# Patient Record
Sex: Female | Born: 1990 | Race: Black or African American | Hispanic: No | Marital: Single | Smoking: Never smoker
Health system: Southern US, Community
[De-identification: ages and names within clinical notes are randomized; demographics above are authoritative.]

## PROBLEM LIST (undated history)

## (undated) DIAGNOSIS — N939 Abnormal uterine and vaginal bleeding, unspecified: Secondary | ICD-10-CM

## (undated) DIAGNOSIS — Z973 Presence of spectacles and contact lenses: Secondary | ICD-10-CM

## (undated) DIAGNOSIS — R7303 Prediabetes: Secondary | ICD-10-CM

## (undated) DIAGNOSIS — D500 Iron deficiency anemia secondary to blood loss (chronic): Secondary | ICD-10-CM

## (undated) DIAGNOSIS — R0600 Dyspnea, unspecified: Secondary | ICD-10-CM

## (undated) DIAGNOSIS — J4530 Mild persistent asthma, uncomplicated: Secondary | ICD-10-CM

## (undated) DIAGNOSIS — D259 Leiomyoma of uterus, unspecified: Secondary | ICD-10-CM

---
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
## 2010-05-30 ENCOUNTER — Other Ambulatory Visit (HOSPITAL_COMMUNITY): Payer: Self-pay | Admitting: Obstetrics and Gynecology

## 2010-05-30 DIAGNOSIS — O2690 Pregnancy related conditions, unspecified, unspecified trimester: Secondary | ICD-10-CM

## 2010-06-05 ENCOUNTER — Ambulatory Visit (HOSPITAL_COMMUNITY): Payer: Self-pay

## 2010-06-10 ENCOUNTER — Ambulatory Visit (HOSPITAL_COMMUNITY)
Admission: RE | Admit: 2010-06-10 | Discharge: 2010-06-10 | Disposition: A | Discharge status: Home / Self Care | Payer: Medicaid Other | Source: Ambulatory Visit | Attending: Obstetrics and Gynecology | Admitting: Obstetrics and Gynecology

## 2010-06-10 DIAGNOSIS — O352XX0 Maternal care for (suspected) hereditary disease in fetus, not applicable or unspecified: Secondary | ICD-10-CM | POA: Insufficient documentation

## 2010-06-10 DIAGNOSIS — O34219 Maternal care for unspecified type scar from previous cesarean delivery: Secondary | ICD-10-CM | POA: Insufficient documentation

## 2010-06-10 DIAGNOSIS — Z1389 Encounter for screening for other disorder: Secondary | ICD-10-CM | POA: Insufficient documentation

## 2010-06-10 DIAGNOSIS — O358XX0 Maternal care for other (suspected) fetal abnormality and damage, not applicable or unspecified: Secondary | ICD-10-CM | POA: Insufficient documentation

## 2010-06-10 DIAGNOSIS — O0930 Supervision of pregnancy with insufficient antenatal care, unspecified trimester: Secondary | ICD-10-CM | POA: Insufficient documentation

## 2010-06-10 DIAGNOSIS — Z363 Encounter for antenatal screening for malformations: Secondary | ICD-10-CM | POA: Insufficient documentation

## 2010-06-10 DIAGNOSIS — O2690 Pregnancy related conditions, unspecified, unspecified trimester: Secondary | ICD-10-CM

## 2010-06-30 LAB — BASIC METABOLIC PANEL
Calcium: 9.6 mg/dL (ref 8.4–10.5)
Glucose, Bld: 97 mg/dL (ref 70–99)
Sodium: 138 mEq/L (ref 135–145)

## 2010-06-30 LAB — HEPATIC FUNCTION PANEL
AST: 23 U/L (ref 0–37)
Bilirubin, Direct: <0.1 mg/dL (ref 0.0–0.3)
Total Bilirubin: 0.3 mg/dL (ref 0.3–1.2)

## 2010-06-30 LAB — URINALYSIS, ROUTINE W REFLEX MICROSCOPIC
Nitrite: NEGATIVE
Specific Gravity, Urine: 1.011 (ref 1.005–1.030)
Urobilinogen, UA: 0.2 mg/dL (ref 0.0–1.0)

## 2010-06-30 LAB — CBC
Hemoglobin: 12.8 g/dL (ref 12.0–16.0)
RDW: 14.3 % (ref 11.4–15.5)

## 2010-06-30 LAB — DRUGS OF ABUSE SCREEN W/O ALC, ROUTINE URINE
Amphetamine Screen, Ur: NEGATIVE
Benzodiazepines.: NEGATIVE
Cocaine Metabolites: NEGATIVE
Opiate Screen, Urine: NEGATIVE
Phencyclidine (PCP): NEGATIVE
Propoxyphene: NEGATIVE

## 2010-06-30 LAB — DIFFERENTIAL
Basophils Absolute: 0 10*3/uL (ref 0.0–0.1)
Lymphocytes Relative: 41 % (ref 24–48)
Monocytes Absolute: 0.5 10*3/uL (ref 0.2–1.2)
Neutro Abs: 2.8 10*3/uL (ref 1.7–8.0)

## 2010-06-30 LAB — TSH: TSH: 0.516 u[IU]/mL (ref 0.350–4.500)

## 2010-06-30 LAB — GC/CHLAMYDIA PROBE AMP, URINE
Chlamydia, Swab/Urine, PCR: NEGATIVE
GC Probe Amp, Urine: NEGATIVE

## 2010-06-30 LAB — T4, FREE: Free T4: 0.99 ng/dL (ref 0.89–1.80)

## 2010-06-30 LAB — RPR: RPR Ser Ql: NONREACTIVE
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
## 2017-04-13 ENCOUNTER — Encounter (HOSPITAL_COMMUNITY): Payer: Self-pay | Admitting: Emergency Medicine

## 2017-04-13 ENCOUNTER — Ambulatory Visit (HOSPITAL_COMMUNITY)
Admission: EM | Admit: 2017-04-13 | Discharge: 2017-04-13 | Disposition: A | Discharge status: Home / Self Care | Payer: Self-pay | Attending: Family Medicine | Admitting: Family Medicine

## 2017-04-13 ENCOUNTER — Other Ambulatory Visit: Payer: Self-pay

## 2017-04-13 DIAGNOSIS — A749 Chlamydial infection, unspecified: Secondary | ICD-10-CM

## 2017-04-13 MED ORDER — AZITHROMYCIN 250 MG PO TABS
ORAL_TABLET | ORAL | Status: AC
  Filled 2017-04-13: qty 4

## 2017-04-13 MED ORDER — AZITHROMYCIN 250 MG PO TABS
1000.0000 mg | ORAL_TABLET | Freq: Once | ORAL | Status: AC
  Administered 2017-04-13: 1000 mg via ORAL

## 2017-04-13 NOTE — ED Provider Notes (Signed)
  [facility]   [id number] 04/13/17 Arrival Time: 1550  ASSESSMENT & PLAN:  1. Chlamydia     Meds ordered this encounter  Medications  . azithromycin (ZITHROMAX) tablet 1,000 mg   She declines further testing today. Testing approx 2 weeks ago revealed + chlamydia and - gonorrhea. Instructed to refrain from sexual activity for at least seven days. Her partner is being treated also.  Reviewed expectations re: course of current medical issues. Questions answered. Outlined signs and symptoms indicating need for more acute intervention. Patient verbalized understanding. After Visit Summary given.   SUBJECTIVE:  Joanne Schneider is a 27 y.o. female who presents with complaint of slight vaginal discharge. Onset gradual, a few weeks ago. Describes discharge as thin and white/yellow but intermittent. Urinary symptoms: none. Afebrile. No abdominal or pelvic pain. No n/v. No rashes or lesions. Sexually active with single female partner. OTC treatment: None. History of similar symptoms: No  Reports recent STD testing 1-2 weeks ago revealed positive Chlamydia. Requesting treatment today.  Patient's last menstrual period was 03/13/2017.  ROS: As per HPI.  OBJECTIVE:  Vitals:   04/13/17 1635  BP: 121/84  Pulse: (!) 103  Resp: 18  Temp: 98.8 F (37.1 C)  TempSrc: Oral  SpO2: 96%    General appearance: alert, cooperative, appears stated age and no distress Throat: lips, mucosa, and tongue normal; teeth and gums normal Back: no CVA tenderness Abdomen: soft, non-tender; bowel sounds normal; no masses or organomegaly; no guarding or rebound tenderness GU: declines Skin: warm and dry Psychological:  Alert and cooperative. Normal mood and affect.   No Known Allergies   Family History  Problem Relation Age of Onset  . Diabetes Father   . Cancer Sister    Social History   Socioeconomic History  . Marital status: Single    Spouse name: Not on file  . Number of  children: Not on file  . Years of education: Not on file  . Highest education level: Not on file  Social Needs  . Financial resource strain: Not on file  . Food insecurity - worry: Not on file  . Food insecurity - inability: Not on file  . Transportation needs - medical: Not on file  . Transportation needs - non-medical: Not on file  Occupational History  . Not on file  Tobacco Use  . Smoking status: Never Smoker  Substance and Sexual Activity  . Alcohol use: Yes  . Drug use: Yes    Types: Marijuana  . Sexual activity: Not on file  Other Topics Concern  . Not on file  Social History Narrative  . Not on file          Mardella LaymanHagler, Gaelle Adriance, MD 04/14/17 [phone]

## 2017-04-13 NOTE — ED Notes (Signed)
Sent patient to bathroom for a dirty and clean specimen with instruction

## 2017-04-13 NOTE — Discharge Instructions (Signed)
You have been given the following medications today for treatment of chlamydia:  azithromycin (ZITHROMAX) tablet 1,000 mg  Please refrain from all sexual activity for at least the next seven days.
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
## 2018-11-27 ENCOUNTER — Encounter (HOSPITAL_COMMUNITY): Payer: Self-pay | Admitting: Emergency Medicine

## 2018-11-27 ENCOUNTER — Emergency Department (HOSPITAL_COMMUNITY)
Admission: EM | Admit: 2018-11-27 | Discharge: 2018-11-27 | Disposition: A | Discharge status: Home / Self Care | Payer: Self-pay | Attending: Emergency Medicine | Admitting: Emergency Medicine

## 2018-11-27 ENCOUNTER — Other Ambulatory Visit: Payer: Self-pay

## 2018-11-27 DIAGNOSIS — R112 Nausea with vomiting, unspecified: Secondary | ICD-10-CM | POA: Insufficient documentation

## 2018-11-27 DIAGNOSIS — R102 Pelvic and perineal pain: Secondary | ICD-10-CM | POA: Insufficient documentation

## 2018-11-27 LAB — COMPREHENSIVE METABOLIC PANEL
ALT: 17 U/L (ref 0–44)
AST: 23 U/L (ref 15–41)
Albumin: 4.2 g/dL (ref 3.5–5.0)
Alkaline Phosphatase: 52 U/L (ref 38–126)
Anion gap: 11 (ref 5–15)
BUN: 8 mg/dL (ref 6–20)
CO2: 23 mmol/L (ref 22–32)
Calcium: 8.9 mg/dL (ref 8.9–10.3)
Chloride: 106 mmol/L (ref 98–111)
Creatinine, Ser: 0.74 mg/dL (ref 0.44–1.00)
GFR calc Af Amer: >60 mL/min (ref >60)
GFR calc non Af Amer: >60 mL/min (ref >60)
Glucose, Bld: 105 mg/dL — ABNORMAL HIGH (ref 70–99)
Potassium: 3.9 mmol/L (ref 3.5–5.1)
Sodium: 140 mmol/L (ref 135–145)
Total Bilirubin: 0.6 mg/dL (ref 0.3–1.2)
Total Protein: 7.8 g/dL (ref 6.5–8.1)

## 2018-11-27 LAB — CBC WITH DIFFERENTIAL/PLATELET
Abs Immature Granulocytes: 0.07 10*3/uL (ref 0.00–0.07)
Basophils Absolute: 0 10*3/uL (ref 0.0–0.1)
Basophils Relative: 0 %
Eosinophils Absolute: 0 10*3/uL (ref 0.0–0.5)
Eosinophils Relative: 0 %
HCT: 40.2 % (ref 36.0–46.0)
Hemoglobin: 12.7 g/dL (ref 12.0–15.0)
Immature Granulocytes: 1 %
Lymphocytes Relative: 15 %
Lymphs Abs: 1.6 10*3/uL (ref 0.7–4.0)
MCH: 28.2 pg (ref 26.0–34.0)
MCHC: 31.6 g/dL (ref 30.0–36.0)
MCV: 89.1 fL (ref 80.0–100.0)
Monocytes Absolute: 0.3 10*3/uL (ref 0.1–1.0)
Monocytes Relative: 3 %
Neutro Abs: 8.6 10*3/uL — ABNORMAL HIGH (ref 1.7–7.7)
Neutrophils Relative %: 81 %
Platelets: 272 10*3/uL (ref 150–400)
RBC: 4.51 MIL/uL (ref 3.87–5.11)
RDW: 13.3 % (ref 11.5–15.5)
WBC: 10.6 10*3/uL — ABNORMAL HIGH (ref 4.0–10.5)
nRBC: 0 % (ref 0.0–0.2)

## 2018-11-27 LAB — POC URINE PREG, ED: Preg Test, Ur: NEGATIVE

## 2018-11-27 LAB — URINALYSIS, ROUTINE W REFLEX MICROSCOPIC
Bilirubin Urine: NEGATIVE
Glucose, UA: NEGATIVE mg/dL
Hgb urine dipstick: NEGATIVE
Ketones, ur: 80 mg/dL — AB
Leukocytes,Ua: NEGATIVE
Nitrite: NEGATIVE
Protein, ur: NEGATIVE mg/dL
Specific Gravity, Urine: 1.025 (ref 1.005–1.030)
pH: 6 (ref 5.0–8.0)

## 2018-11-27 LAB — I-STAT BETA HCG BLOOD, ED (MC, WL, AP ONLY): I-stat hCG, quantitative: 5.1 m[IU]/mL — ABNORMAL HIGH (ref <5)

## 2018-11-27 LAB — LIPASE, BLOOD: Lipase: 27 U/L (ref 11–51)

## 2018-11-27 MED ORDER — SODIUM CHLORIDE 0.9 % IV BOLUS
1000.0000 mL | Freq: Once | INTRAVENOUS | Status: AC
  Administered 2018-11-27: 1000 mL via INTRAVENOUS

## 2018-11-27 MED ORDER — HALOPERIDOL LACTATE 5 MG/ML IJ SOLN
2.0000 mg | Freq: Once | INTRAMUSCULAR | Status: AC
  Administered 2018-11-27: 13:00:00 2 mg via INTRAVENOUS
  Filled 2018-11-27: qty 1

## 2018-11-27 NOTE — ED Notes (Signed)
Pt requesting something to drink. Messaged Dr Langston Masker. Awaiting a response.

## 2018-11-27 NOTE — ED Triage Notes (Signed)
Pt reports symptoms started around 5am. Reports intermittent abd pains. Reports vomiting episodes every 30 minutes.  LMP-8/16-8/21, then Sept had dark spotting 2-8.  Smokes weed everyday, last yesterday, none today.

## 2018-11-27 NOTE — ED Provider Notes (Signed)
[facility] DEPT Provider Note   CSN: [id number] Arrival date & time: 11/27/18  1132     History   Chief Complaint Nausea and vomiting  HPI Joanne Schneider is a 28 y.o. female with a history of daily marijuana smoking presented emergency department with abrupt onset of nausea and vomiting.  She reports she was in her usual state of health yesterday, and reports she did not eat eat any food yesterday because she was upset.  She states she went to bed and woke up promptly around 5 AM with sudden onset abdominal discomfort, and had multiple episodes of nausea and vomiting.  She reports her symptoms have improved since arriving in the ED but she still has diffuse cramping in her abdomen essentially located suprapubically and near her epigastrium.  1 episode of nausea and vomiting like this in the past, treated in [facility] ED and sent home.  PSHx of 1 C-section  No other medical problems NKDA  She denies any history of gastroparesis or small bowel obstruction that she is aware of.  She denies any fevers, chills, dysuria, hematuria, vaginal discharge.  Since arriving in the emergency department she did have one loose bowel movement here in the ED and is able to pass gas.     HPI  History reviewed. No pertinent past medical history.  There are no active problems to display for this patient.   Past Surgical History:  Procedure Laterality Date  . CESAREAN SECTION       OB History   No obstetric history on file.      Home Medications    Prior to Admission medications   Not on File    Family History Family History  Problem Relation Age of Onset  . Diabetes Father   . Cancer Sister     Social History Social History   Tobacco Use  . Smoking status: Never Smoker  . Smokeless tobacco: Never Used  Substance Use Topics  . Alcohol use: Yes  . Drug use: Yes    Types: Marijuana     Allergies   Percocet  [oxycodone-acetaminophen]   Review of Systems Review of Systems  Constitutional: Negative for chills and fever.  Eyes: Negative for pain and visual disturbance.  Respiratory: Negative for cough and shortness of breath.   Cardiovascular: Negative for chest pain and palpitations.  Gastrointestinal: Positive for abdominal pain, nausea and vomiting. Negative for abdominal distention, blood in stool and constipation.  Genitourinary: Negative for dysuria and hematuria.  Musculoskeletal: Negative for arthralgias and back pain.  Skin: Negative for color change and rash.  Neurological: Negative for seizures and syncope.  All other systems reviewed and are negative.    Physical Exam Updated Vital Signs BP 109/66 (BP Location: Left Arm)   Pulse 60   Temp 97.7 F (36.5 C) (Oral)   Resp 16   LMP 11/16/2018   SpO2 100%   Physical Exam Vitals signs and nursing note reviewed.  Constitutional:      General: She is not in acute distress.    Appearance: She is well-developed.  HENT:     Head: Normocephalic and atraumatic.  Eyes:     Conjunctiva/sclera: Conjunctivae normal.  Neck:     Musculoskeletal: Neck supple.  Cardiovascular:     Rate and Rhythm: Normal rate and regular rhythm.     Heart sounds: No murmur.  Pulmonary:     Effort: Pulmonary effort is normal. No respiratory distress.     Breath sounds: Normal  breath sounds.  Abdominal:     General: Abdomen is flat. There is no distension.     Palpations: Abdomen is soft.     Tenderness: There is abdominal tenderness in the epigastric area. There is no right CVA tenderness, left CVA tenderness, guarding or rebound. Negative signs include Murphy's sign, Rovsing's sign, McBurney's sign and psoas sign.     Comments: Mild tenderness  Skin:    General: Skin is warm and dry.  Neurological:     General: No focal deficit present.     Mental Status: She is alert and oriented to person, place, and time.  Psychiatric:        Mood and  Affect: Mood normal.        Behavior: Behavior normal.      ED Treatments / Results  Labs (all labs ordered are listed, but only abnormal results are displayed) Labs Reviewed  COMPREHENSIVE METABOLIC PANEL - Abnormal; Notable for the following components:      Result Value   Glucose, Bld 105 (*)    All other components within normal limits  CBC WITH DIFFERENTIAL/PLATELET - Abnormal; Notable for the following components:   WBC 10.6 (*)    Neutro Abs 8.6 (*)    All other components within normal limits  URINALYSIS, ROUTINE W REFLEX MICROSCOPIC - Abnormal; Notable for the following components:   Ketones, ur 80 (*)    All other components within normal limits  I-STAT BETA HCG BLOOD, ED ([facility], [facility], [facility] ONLY) - Abnormal; Notable for the following components:   I-stat hCG, quantitative 5.1 (*)    All other components within normal limits  LIPASE, BLOOD  POC URINE PREG, ED    EKG None  Radiology No results found.  Procedures Procedures (including critical care time)  Medications Ordered in ED Medications  sodium chloride 0.9 % bolus 1,000 mL (0 mLs Intravenous Stopped 11/27/18 1454)  haloperidol lactate (HALDOL) injection 2 mg (2 mg Intravenous Given 11/27/18 1313)     Initial Impression / Assessment and Plan / ED Course  I have reviewed the triage vital signs and the nursing notes.  Pertinent labs & imaging results that were available during my care of the patient were reviewed by me and considered in my medical decision making (see chart for details).   28 year old female presented emerge department with abdominal pain and abrupt onset of nausea and vomiting beginning at 5 AM this morning.  She is a daily marijuana smoker and I suspect this may be related to cyclical vomiting.  However we will check electrolytes to determine if there are no derangements as well as a lipase level.  We will also check a UA as she has some mild suprapubic tenderness.  We will check a pregnancy  test.  She has no focal tenderness of right upper quadrant negative Murphy sign, I have a lower suspicion for acute cholecystitis.  She has no abdominal distention to suggest small bowel obstruction.  She did have a bowel movement while here in the ED.  I have a lower suspicion for sepsis or acute intra-abdominal infection given her benign physical exam and her vital signs here.  Plan to give IV fluids and a dose of IV Haldol, p.o. challenge and reassess.   Final Clinical Impressions(s) / ED Diagnoses   Final diagnoses:  Non-intractable vomiting with nausea, unspecified vomiting type    ED Discharge Orders    None       Terald Sleeperrifan, Dotti Busey J, MD 11/27/18 2309

## 2018-11-27 NOTE — Discharge Instructions (Addendum)
Please avoid smoking marijuana or using this substance.  It may be triggering your nausea and vomiting.  Drink LOTS of fluids for the next few days - enough that your urine looks clear.

## 2018-11-27 NOTE — ED Notes (Signed)
Pt given Sprite 

## 2018-11-27 NOTE — ED Notes (Signed)
Urine sample not obtained when patient went to restroom.

## 2018-11-27 NOTE — ED Triage Notes (Signed)
[facility] pt from home for n/v that started this morning. Pt also having lower abd pains.  LMP 9/6-8 had heavier flow.  Vitals: 124/92, 76hr 16R.

## 2019-06-11 ENCOUNTER — Encounter: Payer: Self-pay | Admitting: Emergency Medicine

## 2019-06-11 ENCOUNTER — Other Ambulatory Visit: Payer: Self-pay

## 2019-06-11 ENCOUNTER — Emergency Department: Payer: Self-pay

## 2019-06-11 ENCOUNTER — Emergency Department
Admission: EM | Admit: 2019-06-11 | Discharge: 2019-06-11 | Disposition: A | Discharge status: Home / Self Care | Payer: Self-pay | Attending: Emergency Medicine | Admitting: Emergency Medicine

## 2019-06-11 DIAGNOSIS — Z202 Contact with and (suspected) exposure to infections with a predominantly sexual mode of transmission: Secondary | ICD-10-CM | POA: Insufficient documentation

## 2019-06-11 DIAGNOSIS — L03115 Cellulitis of right lower limb: Secondary | ICD-10-CM | POA: Insufficient documentation

## 2019-06-11 LAB — WET PREP, GENITAL
Clue Cells Wet Prep HPF POC: NONE SEEN
Sperm: NONE SEEN
Trich, Wet Prep: NONE SEEN
Yeast Wet Prep HPF POC: NONE SEEN

## 2019-06-11 LAB — URINALYSIS, COMPLETE (UACMP) WITH MICROSCOPIC
Bilirubin Urine: NEGATIVE
Glucose, UA: NEGATIVE mg/dL
Hgb urine dipstick: NEGATIVE
Ketones, ur: NEGATIVE mg/dL
Leukocytes,Ua: NEGATIVE
Nitrite: NEGATIVE
Protein, ur: NEGATIVE mg/dL
Specific Gravity, Urine: 1.016 (ref 1.005–1.030)
pH: 7 (ref 5.0–8.0)

## 2019-06-11 LAB — CHLAMYDIA/NGC RT PCR (ARMC ONLY)??????????: N gonorrhoeae: NOT DETECTED

## 2019-06-11 LAB — CBC WITH DIFFERENTIAL/PLATELET
Abs Immature Granulocytes: 0.02 10*3/uL (ref 0.00–0.07)
Basophils Absolute: 0 10*3/uL (ref 0.0–0.1)
Basophils Relative: 0 %
Eosinophils Absolute: 0 10*3/uL (ref 0.0–0.5)
Eosinophils Relative: 0 %
HCT: 41.2 % (ref 36.0–46.0)
Hemoglobin: 13.3 g/dL (ref 12.0–15.0)
Immature Granulocytes: 0 %
Lymphocytes Relative: 38 %
Lymphs Abs: 2.6 10*3/uL (ref 0.7–4.0)
MCH: 28.3 pg (ref 26.0–34.0)
MCHC: 32.3 g/dL (ref 30.0–36.0)
MCV: 87.7 fL (ref 80.0–100.0)
Monocytes Absolute: 0.6 10*3/uL (ref 0.1–1.0)
Monocytes Relative: 9 %
Neutro Abs: 3.5 10*3/uL (ref 1.7–7.7)
Neutrophils Relative %: 53 %
Platelets: 267 10*3/uL (ref 150–400)
RBC: 4.7 MIL/uL (ref 3.87–5.11)
RDW: 12.7 % (ref 11.5–15.5)
WBC: 6.8 10*3/uL (ref 4.0–10.5)
nRBC: 0 % (ref 0.0–0.2)

## 2019-06-11 LAB — BASIC METABOLIC PANEL
Anion gap: 8 (ref 5–15)
BUN: 6 mg/dL (ref 6–20)
CO2: 27 mmol/L (ref 22–32)
Calcium: 9.4 mg/dL (ref 8.9–10.3)
Chloride: 102 mmol/L (ref 98–111)
Creatinine, Ser: 0.72 mg/dL (ref 0.44–1.00)
GFR calc Af Amer: >60 mL/min (ref >60)
GFR calc non Af Amer: >60 mL/min (ref >60)
Glucose, Bld: 93 mg/dL (ref 70–99)
Potassium: 4.3 mmol/L (ref 3.5–5.1)
Sodium: 137 mmol/L (ref 135–145)

## 2019-06-11 LAB — POCT PREGNANCY, URINE: Preg Test, Ur: NEGATIVE

## 2019-06-11 LAB — CHLAMYDIA/NGC RT PCR (ARMC ONLY): Chlamydia Tr: NOT DETECTED

## 2019-06-11 MED ORDER — LIDOCAINE HCL (PF) 1 % IJ SOLN
2.1000 mL | Freq: Once | INTRAMUSCULAR | Status: AC
  Administered 2019-06-11: 16:00:00 2.1 mL
  Filled 2019-06-11: qty 5

## 2019-06-11 MED ORDER — CEPHALEXIN 500 MG PO CAPS: 500.0000 mg | ORAL_CAPSULE | Freq: Three times a day (TID) | ORAL | 0 refills | Status: DC

## 2019-06-11 MED ORDER — CEFTRIAXONE SODIUM 1 G IJ SOLR
1.0000 g | Freq: Once | INTRAMUSCULAR | Status: AC
  Administered 2019-06-11: 1 g via INTRAMUSCULAR
  Filled 2019-06-11: qty 10

## 2019-06-11 MED ORDER — AZITHROMYCIN 500 MG PO TABS
1000.0000 mg | ORAL_TABLET | Freq: Once | ORAL | Status: AC
  Administered 2019-06-11: 1000 mg via ORAL
  Filled 2019-06-11: qty 2

## 2019-06-11 NOTE — ED Triage Notes (Signed)
Pt to ER states 2 days ago she hit her right ankle against her laptop while lying in bed. States noticed next AM that it felt like a carpet burn and then several hours later she was unable to bear weight, noted swelling and redness and increased pain.

## 2019-06-11 NOTE — Discharge Instructions (Signed)
Your chlamydia and gonorrhea test will be back in 24 to 48 hours.  Check MyChart for your results.  You have already been treated for these infections. If positive you should notify your partner and they should be tested/treated. If the redness on the ankle is spreading and becoming worse please return emergency department. Elevate the ankle above your heart while at home. Return if worsening

## 2019-06-11 NOTE — ED Provider Notes (Signed)
[facility] ([facility] Emergency Department Provider Note  ____________________________________________   First MD Initiated Contact with Patient 06/11/19 1356     (approximate)  I have reviewed the triage vital signs and the nursing notes.   HISTORY  Chief Complaint Ankle Pain    HPI Joanne Schneider is a 29 y.o. female presents emergency department complaining of right ankle pain.  Patient scraped her ankle on the bed she and itch of the computer.  She states it felt like a carpet burn.  Ankle has become very swollen and tender.  No fever or chills.  Unable to evaluate.    History reviewed. No pertinent past medical history.  There are no problems to display for this patient.   Past Surgical History:  Procedure Laterality Date  . CESAREAN SECTION      Prior to Admission medications   Medication Sig Start Date End Date Taking? Authorizing Provider  cephALEXin (KEFLEX) 500 MG capsule Take 1 capsule (500 mg total) by mouth 3 (three) times daily. 06/11/19   Sherrie Mustache Roselyn Bering, PA-C    Allergies Shellfish allergy, Peanut butter flavor, and Percocet [oxycodone-acetaminophen]  Family History  Problem Relation Age of Onset  . Diabetes Father   . Cancer Sister     Social History Social History   Tobacco Use  . Smoking status: Never Smoker  . Smokeless tobacco: Never Used  Substance Use Topics  . Alcohol use: Yes  . Drug use: Yes    Types: Marijuana    Review of Systems  Constitutional: No fever/chills Eyes: No visual changes. ENT: No sore throat. Respiratory: Denies cough Cardiovascular: Denies chest pain Gastrointestinal: Denies abdominal pain Genitourinary: Negative for dysuria. Musculoskeletal: Negative for back pain.  Positive for right ankle pain Skin: Negative for rash. Psychiatric: no mood changes,     ____________________________________________   PHYSICAL EXAM:  VITAL SIGNS: ED Triage Vitals  Enc Vitals Group     BP 06/11/19  1353 114/74     Pulse Rate 06/11/19 1353 70     Resp 06/11/19 1353 18     Temp 06/11/19 1353 98.5 F (36.9 C)     Temp Source 06/11/19 1353 Oral     SpO2 06/11/19 1353 100 %     Weight 06/11/19 1354 148 lb (67.1 kg)     Height 06/11/19 1354 5\' 5"  (1.651 m)     Head Circumference --      Peak Flow --      Pain Score 06/11/19 1353 9     Pain Loc --      Pain Edu? --      Excl. in GC? --     Constitutional: Alert and oriented. Well appearing and in no acute distress. Eyes: Conjunctivae are normal.  Head: Atraumatic. Nose: No congestion/rhinnorhea. Mouth/Throat: Mucous membranes are moist.   Neck:  supple no lymphadenopathy noted Cardiovascular: Normal rate, regular rhythm. Respiratory: Normal respiratory effort.  No retractions, GU: External vaginal exam does not show any herpetic type lesions, speculum exam shows a thick white discharge, there is no cervical motion tenderness Musculoskeletal: The right ankle is red and swollen, tender to palpation, patient is unable to bear weight.  Neurovascular is intact  Neurologic:  Normal speech and language.  Skin:  Skin is warm, dry and intact. No rash noted. Psychiatric: Mood and affect are normal. Speech and behavior are normal.  ____________________________________________   LABS (all labs ordered are listed, but only abnormal results are displayed)  Labs Reviewed  WET PREP,  GENITAL - Abnormal; Notable for the following components:      Result Value   WBC, Wet Prep HPF POC MODERATE (*)    All other components within normal limits  URINALYSIS, COMPLETE (UACMP) WITH MICROSCOPIC - Abnormal; Notable for the following components:   Color, Urine YELLOW (*)    APPearance HAZY (*)    Bacteria, UA RARE (*)    All other components within normal limits  CHLAMYDIA/NGC RT PCR ([facility] ONLY)  BASIC METABOLIC PANEL  CBC WITH DIFFERENTIAL/PLATELET  POC URINE PREG, ED  POCT PREGNANCY, URINE    ____________________________________________   ____________________________________________  RADIOLOGY  X-ray of the right ankle is negative  ____________________________________________   PROCEDURES  Procedure(s) performed: Rocephin 1 g p.o., Zithromax 1 g p.o., crutches were provided for the patient  Procedures    ____________________________________________   INITIAL IMPRESSION / ASSESSMENT AND PLAN / ED COURSE  Pertinent labs & imaging results that were available during my care of the patient were reviewed by me and considered in my medical decision making (see chart for details).   Patient is a 29 year old female presents emergency department with concerns of right ankle redness pain and swelling.  With the differential including a septic joint I did ask her about STDs and she wants to be checked as she is not sure.  She denies any fever or chills.  No abdominal pain.  Physical exam shows the right ankle to be red swollen and tender, there is a small abrasion noted on the lateral aspect.  Pelvic exam is basically normal other than a small amount of white thick discharge.  DDx: Cellulitis, septic joint, abrasion and wound infection, STDs  CBC is normal, ASIC metabolic panel is normal, POC pregnancy is negative, urinalysis is basically normal, wet prep shows moderate WBCs, GC/chlamydia is pending  X-ray of the right ankle is negative for osteomyelitis or foreign body.  Does show some soft tissue swelling  I explained all these findings to the patient.  Due to the concerns of STD, I empirically treated her for chlamydia and gonorrhea.  Explained to her that the Rocephin injection would be at a higher dose than normal due to treating the cellulitis.  She also be given a prescription for Keflex 500 3 times daily.  Return emergency department if worsening.  Follow-up with your regular doctor if not improving in 3 to 5 days.  If STD testing is positive she is to notify her  partner.  They will need to be tested/treated.  She states she understands will comply.  We did discuss safe sex and she was discharged stable condition.    Joanne Schneider was evaluated in Emergency Department on 06/11/2019 for the symptoms described in the history of present illness. She was evaluated in the context of the global COVID-19 pandemic, which necessitated consideration that the patient might be at risk for infection with the SARS-CoV-2 virus that causes COVID-19. Institutional protocols and algorithms that pertain to the evaluation of patients at risk for COVID-19 are in a state of rapid change based on information released by regulatory bodies including the CDC and federal and state organizations. These policies and algorithms were followed during the patient's care in the ED.   As part of my medical decision making, I reviewed the following data within the electronic MEDICAL RECORD NUMBER Nursing notes reviewed and incorporated, Labs reviewed , Old chart reviewed, Radiograph reviewed , Notes from prior ED visits and [place] Controlled Substance Database  ____________________________________________   FINAL CLINICAL  IMPRESSION(S) / ED DIAGNOSES  Final diagnoses:  Cellulitis of right ankle  STD exposure      NEW MEDICATIONS STARTED DURING THIS VISIT:  New Prescriptions   CEPHALEXIN (KEFLEX) 500 MG CAPSULE    Take 1 capsule (500 mg total) by mouth 3 (three) times daily.     Note:  This document was prepared using Dragon voice recognition software and may include unintentional dictation errors.    Versie Starks, PA-C 06/11/19 1546    Vanessa Limestone, MD 06/12/19 [phone]

## 2020-05-13 ENCOUNTER — Encounter (HOSPITAL_COMMUNITY): Payer: Self-pay | Admitting: Urgent Care

## 2020-05-13 ENCOUNTER — Ambulatory Visit (HOSPITAL_COMMUNITY)
Admission: EM | Admit: 2020-05-13 | Discharge: 2020-05-13 | Disposition: A | Discharge status: Home / Self Care | Payer: 59 | Attending: Urgent Care | Admitting: Urgent Care

## 2020-05-13 ENCOUNTER — Ambulatory Visit (INDEPENDENT_AMBULATORY_CARE_PROVIDER_SITE_OTHER): Payer: 59

## 2020-05-13 ENCOUNTER — Other Ambulatory Visit: Payer: Self-pay

## 2020-05-13 DIAGNOSIS — M79675 Pain in left toe(s): Secondary | ICD-10-CM

## 2020-05-13 DIAGNOSIS — M79672 Pain in left foot: Secondary | ICD-10-CM

## 2020-05-13 DIAGNOSIS — S90122A Contusion of left lesser toe(s) without damage to nail, initial encounter: Secondary | ICD-10-CM

## 2020-05-13 DIAGNOSIS — S99922A Unspecified injury of left foot, initial encounter: Secondary | ICD-10-CM

## 2020-05-13 MED ORDER — NAPROXEN 500 MG PO TABS: 500.0000 mg | ORAL_TABLET | Freq: Two times a day (BID) | ORAL | 0 refills | Status: DC

## 2020-05-13 NOTE — ED Provider Notes (Signed)
[facility]   MRN: [id number] DOB: April 13, 1990  Subjective:   Joanne Schneider is a 30 y.o. female presenting for acute onset of left pinky toe pain and swelling.  Patient states that symptoms started from accidentally dropping a wooden tray on her foot.  Has had significant difficulty moving her toe, severe pain.  Wants to make sure there is no fracture.  Denies taking chronic medications.  Allergies  Allergen Reactions  . Shellfish Allergy   . Peanut Butter Flavor Other (See Comments)  . Percocet [Oxycodone-Acetaminophen] Hives and Itching    History reviewed. No pertinent past medical history.   Past Surgical History:  Procedure Laterality Date  . CESAREAN SECTION      Family History  Problem Relation Age of Onset  . Diabetes Father   . Cancer Sister     Social History   Tobacco Use  . Smoking status: Never Smoker  . Smokeless tobacco: Never Used  Substance Use Topics  . Alcohol use: Yes  . Drug use: Yes    Types: Marijuana    ROS   Objective:   Vitals: BP 124/66 (BP Location: Right Arm)   Pulse 84   Temp 98.2 F (36.8 C) (Oral)   Resp 18   LMP 04/22/2020   SpO2 100%   Physical Exam Constitutional:      General: She is not in acute distress.    Appearance: Normal appearance. She is well-developed. She is not ill-appearing, toxic-appearing or diaphoretic.  HENT:     Head: Normocephalic and atraumatic.     Nose: Nose normal.     Mouth/Throat:     Mouth: Mucous membranes are moist.     Pharynx: Oropharynx is clear.  Eyes:     General: No scleral icterus.       Right eye: No discharge.        Left eye: No discharge.     Extraocular Movements: Extraocular movements intact.     Conjunctiva/sclera: Conjunctivae normal.     Pupils: Pupils are equal, round, and reactive to light.  Cardiovascular:     Rate and Rhythm: Normal rate.  Pulmonary:     Effort: Pulmonary effort is normal.  Musculoskeletal:       Feet:  Skin:     General: Skin is warm and dry.  Neurological:     General: No focal deficit present.     Mental Status: She is alert and oriented to person, place, and time.  Psychiatric:        Mood and Affect: Mood normal.        Behavior: Behavior normal.        Thought Content: Thought content normal.        Judgment: Judgment normal.     DG Foot Complete Left  Result Date: 05/13/2020 CLINICAL DATA:  Left foot injury, fifth digit pain EXAM: LEFT FOOT - COMPLETE 3+ VIEW COMPARISON:  None. FINDINGS: Frontal, oblique, and lateral views of the left foot are obtained. No acute fracture, subluxation, or dislocation. Soft tissues are unremarkable. IMPRESSION: 1. No acute displaced fracture. Electronically Signed   By: Sharlet Salina M.D.   On: 05/13/2020 17:18    Assessment and Plan :   PDMP not reviewed this encounter.  1. Foot pain, left   2. Contusion of fifth toe of left foot, initial encounter     We will manage conservatively with rice method, naproxen. Counseled patient on potential for adverse effects with medications prescribed/recommended today, ER  and return-to-clinic precautions discussed, patient verbalized understanding.    Wallis Bamberg, [place] 05/13/20 1747

## 2020-05-13 NOTE — ED Triage Notes (Signed)
Pt presents with left pinky toe pain. States dropped wooden food tray on toe last night.

## 2020-06-06 ENCOUNTER — Encounter (HOSPITAL_COMMUNITY): Payer: Self-pay

## 2020-06-06 ENCOUNTER — Ambulatory Visit (HOSPITAL_COMMUNITY)
Admission: EM | Admit: 2020-06-06 | Discharge: 2020-06-06 | Disposition: A | Discharge status: Home / Self Care | Payer: 59 | Attending: Emergency Medicine | Admitting: Emergency Medicine

## 2020-06-06 ENCOUNTER — Other Ambulatory Visit: Payer: Self-pay

## 2020-06-06 DIAGNOSIS — Z113 Encounter for screening for infections with a predominantly sexual mode of transmission: Secondary | ICD-10-CM | POA: Diagnosis not present

## 2020-06-06 LAB — HIV ANTIBODY (ROUTINE TESTING W REFLEX): HIV Screen 4th Generation wRfx: NONREACTIVE

## 2020-06-06 LAB — POC URINE PREG, ED: Preg Test, Ur: NEGATIVE

## 2020-06-06 NOTE — ED Triage Notes (Signed)
Pt reports sore throat x 2 days; vagina swelling since this morning. Pt denies fever, vaginal discharge,vaginal itchy.

## 2020-06-06 NOTE — Discharge Instructions (Addendum)
Labs pending 2-3 days, you will be called for any positive result for treatment  Refrain from sex until labs result, if positive refrain from sex until treatment is complete

## 2020-06-06 NOTE — ED Provider Notes (Signed)
[facility]    CSN: [id number] Arrival date & time: 06/06/20  1739      History   Chief Complaint Chief Complaint  Patient presents with  . Sore Throat    HPI Joanne Schneider is a 30 y.o. female.   Patient presents with sore throat beginning last night. Described as dry, scratchy and causing discomfort when swallowing. Endorses runny nose, nausea, itchy eyes. Denies cough, headaches, congestion, vomiting, diarrhea, abdominal pain. No known sick contact.   C/o of vaginal discomfort and irritation. " feels like swelling at the entry" Denies discharge, itching, urgency, frequency, dysuria, abdominal pain and flank pain.  LMP 2/7. Sexually active. One partner.   History reviewed. No pertinent past medical history.  There are no problems to display for this patient.   Past Surgical History:  Procedure Laterality Date  . CESAREAN SECTION      OB History   No obstetric history on file.      Home Medications    Prior to Admission medications   Medication Sig Start Date End Date Taking? Authorizing Provider  naproxen (NAPROSYN) 500 MG tablet Take 1 tablet (500 mg total) by mouth 2 (two) times daily with a meal. 05/13/20   Wallis Bamberg, PA-C    Family History Family History  Problem Relation Age of Onset  . Diabetes Father   . Cancer Sister     Social History Social History   Tobacco Use  . Smoking status: Never Smoker  . Smokeless tobacco: Never Used  Substance Use Topics  . Alcohol use: Yes  . Drug use: Yes    Types: Marijuana     Allergies   Shellfish allergy, Peanut butter flavor, and Percocet [oxycodone-acetaminophen]   Review of Systems Review of Systems  Constitutional: Negative.   HENT: Positive for congestion, rhinorrhea, sneezing and sore throat. Negative for dental problem, drooling, ear discharge, ear pain, facial swelling, hearing loss, mouth sores, nosebleeds, postnasal drip, sinus pressure, sinus pain, tinnitus, trouble swallowing  and voice change.   Respiratory: Negative.   Cardiovascular: Negative.   Gastrointestinal: Positive for nausea. Negative for abdominal distention, abdominal pain, anal bleeding, blood in stool, constipation, diarrhea, rectal pain and vomiting.  Genitourinary: Negative.   Skin: Negative.      Physical Exam Triage Vital Signs ED Triage Vitals  Enc Vitals Group     BP 06/06/20 1750 117/67     Pulse Rate 06/06/20 1750 72     Resp 06/06/20 1750 16     Temp 06/06/20 1750 98.3 F (36.8 C)     Temp Source 06/06/20 1750 Oral     SpO2 06/06/20 1750 100 %     Weight --      Height --      Head Circumference --      Peak Flow --      Pain Score 06/06/20 1748 7     Pain Loc --      Pain Edu? --      Excl. in GC? --    No data found.  Updated Vital Signs BP 117/67 (BP Location: Right Arm)   Pulse 72   Temp 98.3 F (36.8 C) (Oral)   Resp 16   LMP  (Within Months) Comment: 1 month   SpO2 100%   Visual Acuity Right Eye Distance:   Left Eye Distance:   Bilateral Distance:    Right Eye Near:   Left Eye Near:    Bilateral Near:     Physical Exam  UC Treatments / Results  Labs (all labs ordered are listed, but only abnormal results are displayed) Labs Reviewed  RPR  HIV ANTIBODY (ROUTINE TESTING W REFLEX)  POC URINE PREG, ED  CERVICOVAGINAL ANCILLARY ONLY  CYTOLOGY, (ORAL, ANAL, URETHRAL) ANCILLARY ONLY    EKG   Radiology No results found.  Procedures Procedures (including critical care time)  Medications Ordered in UC Medications - No data to display  Initial Impression / Assessment and Plan / UC Course  I have reviewed the triage vital signs and the nursing notes.  Pertinent labs & imaging results that were available during my care of the patient were reviewed by me and considered in my medical decision making (see chart for details).  Routine screening for STI  1. Patient declined COVID and flu testing, otc remedies suggested for management of  symptoms 2. Oral swab for sti testing- pending, will treat per protocol  3. Vaginal swab for sti testing- pending, will treat per protocol 4. Advised abstinence until labs result and or treatment complete Final Clinical Impressions(s) / UC Diagnoses   Final diagnoses:  Routine screening for STI (sexually transmitted infection)     Discharge Instructions     Labs pending 2-3 days, you will be called for any positive result for treatment  Refrain from sex until labs result, if positive refrain from sex until treatment is complete    ED Prescriptions    None     PDMP not reviewed this encounter.   Valinda Hoar, [place] 06/06/20 [phone]

## 2020-06-07 ENCOUNTER — Telehealth (HOSPITAL_COMMUNITY): Payer: Self-pay | Admitting: Emergency Medicine

## 2020-06-07 LAB — CERVICOVAGINAL ANCILLARY ONLY
Bacterial Vaginitis (gardnerella): NEGATIVE
Candida Glabrata: NEGATIVE
Candida Vaginitis: POSITIVE — AB
Chlamydia: NEGATIVE
Comment: NEGATIVE
Comment: NEGATIVE
Comment: NEGATIVE
Comment: NEGATIVE
Comment: NEGATIVE
Comment: NORMAL
Neisseria Gonorrhea: NEGATIVE
Trichomonas: NEGATIVE

## 2020-06-07 LAB — CYTOLOGY, (ORAL, ANAL, URETHRAL) ANCILLARY ONLY
Chlamydia: NEGATIVE
Comment: NEGATIVE
Comment: NEGATIVE
Comment: NORMAL
Neisseria Gonorrhea: NEGATIVE
Trichomonas: NEGATIVE

## 2020-06-07 LAB — RPR: RPR Ser Ql: NONREACTIVE

## 2020-06-07 MED ORDER — FLUCONAZOLE 150 MG PO TABS: 150.0000 mg | ORAL_TABLET | Freq: Once | ORAL | 0 refills | Status: AC

## 2020-08-31 DIAGNOSIS — Z113 Encounter for screening for infections with a predominantly sexual mode of transmission: Secondary | ICD-10-CM | POA: Insufficient documentation

## 2020-08-31 DIAGNOSIS — Z5321 Procedure and treatment not carried out due to patient leaving prior to being seen by health care provider: Secondary | ICD-10-CM | POA: Insufficient documentation

## 2020-09-01 ENCOUNTER — Other Ambulatory Visit: Payer: Self-pay

## 2020-09-01 ENCOUNTER — Encounter (HOSPITAL_COMMUNITY): Payer: Self-pay | Admitting: *Deleted

## 2020-09-01 ENCOUNTER — Emergency Department (HOSPITAL_COMMUNITY)
Admission: EM | Admit: 2020-09-01 | Discharge: 2020-09-01 | Disposition: A | Discharge status: Home / Self Care | Payer: 59 | Attending: Emergency Medicine | Admitting: Emergency Medicine

## 2020-09-01 LAB — URINALYSIS, ROUTINE W REFLEX MICROSCOPIC
Bilirubin Urine: NEGATIVE
Glucose, UA: NEGATIVE mg/dL
Hgb urine dipstick: NEGATIVE
Ketones, ur: NEGATIVE mg/dL
Leukocytes,Ua: NEGATIVE
Nitrite: NEGATIVE
Protein, ur: NEGATIVE mg/dL
Specific Gravity, Urine: 1.003 — ABNORMAL LOW (ref 1.005–1.030)
pH: 6 (ref 5.0–8.0)

## 2020-09-01 LAB — HIV ANTIBODY (ROUTINE TESTING W REFLEX): HIV Screen 4th Generation wRfx: NONREACTIVE

## 2020-09-01 LAB — RPR: RPR Ser Ql: NONREACTIVE

## 2020-09-01 LAB — PREGNANCY, URINE: Preg Test, Ur: NEGATIVE

## 2020-09-01 NOTE — ED Notes (Signed)
PT left AMA 

## 2020-09-01 NOTE — ED Provider Notes (Signed)
Emergency Medicine Provider Triage Evaluation Note  Gwenith Tschida , a 30 y.o. female  was evaluated in triage.  Pt complains of vaginal odor. Symptoms began tonight. Had unprotected intercourse with female partner 4 days ago. Denies vaginal discharge, abdominal pain, fevers, dysuria. No medications taken PTA.  Review of Systems  Positive: Vaginal odor Negative: Fever, abdominal pain  Physical Exam  There were no vitals taken for this visit. Gen:   Awake, no distress   Resp:  Normal effort  MSK:   Moves extremities without difficulty  Other:  Sense of humor  Medical Decision Making  Medically screening exam initiated at 1:01 AM.  Appropriate orders placed.  Demara Lover was informed that the remainder of the evaluation will be completed by another provider, this initial triage assessment does not replace that evaluation, and the importance of remaining in the ED until their evaluation is complete.  Concern for STD in female without diagnosis   Antony Madura, Cordelia Poche 09/01/20 0103    Wilkie Aye Mayer Masker, MD 09/02/20 0230

## 2020-09-01 NOTE — ED Triage Notes (Signed)
The pt is here for a funny smell r/o std  lmp last month

## 2020-09-02 ENCOUNTER — Encounter (HOSPITAL_COMMUNITY): Payer: Self-pay

## 2020-09-02 ENCOUNTER — Other Ambulatory Visit: Payer: Self-pay

## 2020-09-02 ENCOUNTER — Ambulatory Visit (HOSPITAL_COMMUNITY)
Admission: EM | Admit: 2020-09-02 | Discharge: 2020-09-02 | Disposition: A | Discharge status: Home / Self Care | Payer: Self-pay | Attending: Family Medicine | Admitting: Family Medicine

## 2020-09-02 DIAGNOSIS — N760 Acute vaginitis: Secondary | ICD-10-CM | POA: Insufficient documentation

## 2020-09-02 NOTE — ED Triage Notes (Signed)
Pt presents with vaginal odor X 2 days.

## 2020-09-02 NOTE — ED Provider Notes (Signed)
[facility]    CSN: [id number] Arrival date & time: 09/02/20  1734      History   Chief Complaint Chief Complaint  Patient presents with   Vaginitis    HPI Joanne Schneider is a 30 y.o. female.   Reports vaginal discharge and odor for the last 2 days. Reports unprotected sexual encounter about 5 days ago with a female partner. Reports that this is not her significant other, and that she had a last sexual encounter with her female significant other about a month ago. Denies previous symptoms. Reports that she has had vaginal yeast in the past, but no other issues. Denies dysuria, frequency, urgency, abdominal pain, flank pain, chills, nausea, vomiting, diarrhea, rash, fever, other symptoms.  ROS per HPI  The history is provided by the patient.   History reviewed. No pertinent past medical history.  There are no problems to display for this patient.   Past Surgical History:  Procedure Laterality Date   CESAREAN SECTION      OB History   No obstetric history on file.      Home Medications    Prior to Admission medications   Medication Sig Start Date End Date Taking? Authorizing Provider  naproxen (NAPROSYN) 500 MG tablet Take 1 tablet (500 mg total) by mouth 2 (two) times daily with a meal. 05/13/20   Wallis Bamberg, PA-C    Family History Family History  Problem Relation Age of Onset   Diabetes Father    Cancer Sister     Social History Social History   Tobacco Use   Smoking status: Never   Smokeless tobacco: Never  Substance Use Topics   Alcohol use: Yes   Drug use: Yes    Types: Marijuana     Allergies   Shellfish allergy, Peanut butter flavor, and Percocet [oxycodone-acetaminophen]   Review of Systems Review of Systems   Physical Exam Triage Vital Signs ED Triage Vitals [09/02/20 1830]  Enc Vitals Group     BP (!) 124/92     Pulse Rate 79     Resp 18     Temp 97.9 F (36.6 C)     Temp Source Oral     SpO2 100 %     Weight       Height      Head Circumference      Peak Flow      Pain Score 0     Pain Loc      Pain Edu?      Excl. in GC?    No data found.  Updated Vital Signs BP (!) 124/92 (BP Location: Right Arm)   Pulse 79   Temp 97.9 F (36.6 C) (Oral)   Resp 18   LMP  (LMP Unknown)   SpO2 100%   Visual Acuity Right Eye Distance:   Left Eye Distance:   Bilateral Distance:    Right Eye Near:   Left Eye Near:    Bilateral Near:     Physical Exam Vitals and nursing note reviewed.  Constitutional:      General: She is not in acute distress.    Appearance: Normal appearance. She is well-developed.  HENT:     Head: Normocephalic and atraumatic.     Nose: Nose normal.     Mouth/Throat:     Mouth: Mucous membranes are moist.     Pharynx: Oropharynx is clear.  Eyes:     Extraocular Movements: Extraocular movements intact.  Conjunctiva/sclera: Conjunctivae normal.     Pupils: Pupils are equal, round, and reactive to light.  Cardiovascular:     Rate and Rhythm: Normal rate and regular rhythm.  Pulmonary:     Effort: Pulmonary effort is normal. No respiratory distress.  Abdominal:     General: There is no distension.     Palpations: There is no mass.     Tenderness: There is no abdominal tenderness. There is no right CVA tenderness, left CVA tenderness, guarding or rebound.     Hernia: No hernia is present.  Genitourinary:    Comments: declined Musculoskeletal:        General: Normal range of motion.     Cervical back: Normal range of motion and neck supple.  Skin:    General: Skin is warm and dry.     Capillary Refill: Capillary refill takes less than 2 seconds.  Neurological:     General: No focal deficit present.     Mental Status: She is alert and oriented to person, place, and time.  Psychiatric:        Mood and Affect: Mood normal.        Behavior: Behavior normal.        Thought Content: Thought content normal.     UC Treatments / Results  Labs (all labs ordered are  listed, but only abnormal results are displayed) Labs Reviewed  CERVICOVAGINAL ANCILLARY ONLY    EKG   Radiology No results found.  Procedures Procedures (including critical care time)  Medications Ordered in UC Medications - No data to display  Initial Impression / Assessment and Plan / UC Course  I have reviewed the triage vital signs and the nursing notes.  Pertinent labs & imaging results that were available during my care of the patient were reviewed by me and considered in my medical decision making (see chart for details).    Acute Vaginitis  Self cytology swab obtained in office today Will be in contact with any abnormal results that require further treatment RPR/HIV negative in the ER early this morning UA negative in ER this morning, pregnancy test in ER negative this morning Do no have sex until results are back and negative or until 7 days after beginning treatment, whichever is longer Follow up with this office or with primary care if symptoms are persisting.  Follow up in the ER for high fever, trouble swallowing, trouble breathing, other concerning symptoms.   Final Clinical Impressions(s) / UC Diagnoses   Final diagnoses:  Acute vaginitis     Discharge Instructions      Your vaginal tests are pending.    If your test results are positive, we will call you.    You and your sexual partner(s) may require treatment at that time.    Do not have sexual activity for at least 7 days.         ED Prescriptions   None    PDMP not reviewed this encounter.   Moshe Cipro, NP 09/02/20 1955

## 2020-09-02 NOTE — Discharge Instructions (Addendum)
Your vaginal tests are pending.  If your test results are positive, we will call you.  You and your sexual partner(s) may require treatment at that time.  Do not have sexual activity for at least 7 days.     

## 2020-09-03 LAB — CERVICOVAGINAL ANCILLARY ONLY
Bacterial Vaginitis (gardnerella): POSITIVE — AB
Candida Glabrata: NEGATIVE
Candida Vaginitis: NEGATIVE
Chlamydia: NEGATIVE
Comment: NEGATIVE
Comment: NEGATIVE
Comment: NEGATIVE
Comment: NEGATIVE
Comment: NEGATIVE
Comment: NORMAL
Neisseria Gonorrhea: NEGATIVE
Trichomonas: NEGATIVE

## 2020-09-04 ENCOUNTER — Telehealth: Payer: Self-pay

## 2020-09-04 MED ORDER — METRONIDAZOLE 500 MG PO TABS: 500.0000 mg | ORAL_TABLET | Freq: Two times a day (BID) | ORAL | 0 refills | Status: DC

## 2021-10-29 ENCOUNTER — Ambulatory Visit (HOSPITAL_COMMUNITY)
Admission: EM | Admit: 2021-10-29 | Discharge: 2021-10-29 | Disposition: A | Discharge status: Home / Self Care | Payer: Self-pay | Attending: Nurse Practitioner | Admitting: Nurse Practitioner

## 2021-10-29 ENCOUNTER — Encounter (HOSPITAL_COMMUNITY): Payer: Self-pay | Admitting: Emergency Medicine

## 2021-10-29 DIAGNOSIS — Z130 Encounter for screening for diseases of the blood and blood-forming organs and certain disorders involving the immune mechanism: Secondary | ICD-10-CM | POA: Insufficient documentation

## 2021-10-29 DIAGNOSIS — N898 Other specified noninflammatory disorders of vagina: Secondary | ICD-10-CM | POA: Insufficient documentation

## 2021-10-29 LAB — CBC
HCT: 25.4 % — ABNORMAL LOW (ref 36.0–46.0)
Hemoglobin: 8.1 g/dL — ABNORMAL LOW (ref 12.0–15.0)
MCH: 24.4 pg — ABNORMAL LOW (ref 26.0–34.0)
MCHC: 31.9 g/dL (ref 30.0–36.0)
MCV: 76.5 fL — ABNORMAL LOW (ref 80.0–100.0)
Platelets: 586 10*3/uL — ABNORMAL HIGH (ref 150–400)
RBC: 3.32 MIL/uL — ABNORMAL LOW (ref 3.87–5.11)
RDW: 15.7 % — ABNORMAL HIGH (ref 11.5–15.5)
WBC: 7.2 10*3/uL (ref 4.0–10.5)
nRBC: 0 % (ref 0.0–0.2)

## 2021-10-29 MED ORDER — FLUCONAZOLE 150 MG PO TABS: 150.0000 mg | ORAL_TABLET | Freq: Every day | ORAL | 0 refills | Status: DC

## 2021-10-29 NOTE — Discharge Instructions (Addendum)
Vaginal odor screening pending; Nurse will call with results CBC pending; Nurse will call with results Diflucan 150 mg started  Will hold off any additional treatment for BV at this time until results  PCP referral placed due to additional care needs in the future

## 2021-10-29 NOTE — ED Triage Notes (Addendum)
Patient c/o vaginal odor x 2-3 days ago.   Patient denies vaginal discharge currently. Patient denies dysuria or ABD pain.   Patient endorses having irregular vaginal bleeding that occurred for 2 months prior to onset of symptoms.   Patient endorses onset of irregular bleeding began after eating a " mushroom".   Patient endorses an episode of LOC that happened 1 week ago. Patient denies hitting head. Patient denies any current palpitations or dizziness.   Patient endorses headache at times.   Patient hasn't used any medications for symptoms.

## 2021-10-29 NOTE — ED Provider Notes (Signed)
[facility]    CSN: [id number] Arrival date & time: 10/29/21  1136      History   Chief Complaint Chief Complaint  Patient presents with   Vaginal Odor     HPI Joanne Schneider is a 31 y.o. female.   HPI  She is in today for one week of vaginal odor. She reports that her last menstrual cycle was longer than expected. She did have an episode of passing out. She reports trying a magic mushroom around this time. Denies any pelvic pain or tenderness. Denies vaginal discharge or dysuria.  Denies ulcers or lesions. She denies any risk of STD. She was sexually active prior to the menstrual cycle. She endorses remote history of BV but not recurrent. She endorses asthma; smokes THC no tobacco products. Denies fever, chills, headache,visual changes, dyspnea on exertion, chest pain, nausea, vomiting, or any edema.  She is concern about anemia. She does crave ice . She doe not have a PCP.   History reviewed. No pertinent past medical history.  There are no problems to display for this patient.   Past Surgical History:  Procedure Laterality Date   CESAREAN SECTION      OB History   No obstetric history on file.      Home Medications    Prior to Admission medications   Medication Sig Start Date End Date Taking? Authorizing Provider  fluconazole (DIFLUCAN) 150 MG tablet Take 1 tablet (150 mg total) by mouth daily. 10/29/21  Yes Barbette Merino, NP    Family History Family History  Problem Relation Age of Onset   Diabetes Father    Cancer Sister     Social History Social History   Tobacco Use   Smoking status: Never   Smokeless tobacco: Never  Substance Use Topics   Alcohol use: Yes   Drug use: Yes    Types: Marijuana     Allergies   Shellfish allergy, Peanut butter flavor, and Percocet [oxycodone-acetaminophen]   Review of Systems Review of Systems   Physical Exam Triage Vital Signs ED Triage Vitals  Enc Vitals Group     BP 10/29/21 1224 121/89      Pulse Rate 10/29/21 1224 94     Resp 10/29/21 1224 18     Temp 10/29/21 1224 98.1 F (36.7 C)     Temp Source 10/29/21 1224 Oral     SpO2 10/29/21 1224 100 %     Weight --      Height --      Head Circumference --      Peak Flow --      Pain Score 10/29/21 1234 0     Pain Loc --      Pain Edu? --      Excl. in GC? --    No data found.  Updated Vital Signs BP 121/89 (BP Location: Left Arm)   Pulse 94   Temp 98.1 F (36.7 C) (Oral)   Resp 18   LMP 08/29/2021 (Approximate)   SpO2 100%   Visual Acuity Right Eye Distance:   Left Eye Distance:   Bilateral Distance:    Right Eye Near:   Left Eye Near:    Bilateral Near:     Physical Exam Constitutional:      General: She is not in acute distress.    Appearance: She is not ill-appearing, toxic-appearing or diaphoretic.  HENT:     Head: Normocephalic.     Nose: Nose normal.  Mouth/Throat:     Mouth: Mucous membranes are moist.     Comments: Lips dark Cardiovascular:     Rate and Rhythm: Normal rate and regular rhythm.     Pulses: Normal pulses.     Heart sounds: Normal heart sounds.  Pulmonary:     Effort: Pulmonary effort is normal.     Breath sounds: No stridor. No wheezing.  Musculoskeletal:        General: Normal range of motion.     Cervical back: Normal range of motion.  Skin:    General: Skin is warm and dry.     Capillary Refill: Capillary refill takes less than 2 seconds.  Neurological:     Mental Status: She is alert.  Psychiatric:        Mood and Affect: Mood normal.        Behavior: Behavior normal.      UC Treatments / Results  Labs (all labs ordered are listed, but only abnormal results are displayed) Labs Reviewed  CBC  CERVICOVAGINAL ANCILLARY ONLY    EKG   Radiology No results found.  Procedures Procedures (including critical care time)  Medications Ordered in UC Medications - No data to display  Initial Impression / Assessment and Plan / UC Course  I have  reviewed the triage vital signs and the nursing notes.  Pertinent labs & imaging results that were available during my care of the patient were reviewed by me and considered in my medical decision making (see chart for details).     Vaginal odor Suspected anemia Final Clinical Impressions(s) / UC Diagnoses   Final diagnoses:  Vaginal odor  Screening for iron deficiency anemia     Discharge Instructions      Vaginal odor screening pending; Nurse will call with results CBC pending; Nurse will call with results Diflucan 150 mg started  Will hold off any additional treatment for BV at this time until results  PCP referral placed due to additional care needs in the future     ED Prescriptions     Medication Sig Dispense Auth. Provider   fluconazole (DIFLUCAN) 150 MG tablet Take 1 tablet (150 mg total) by mouth daily. 1 tablet Barbette Merino, NP      PDMP not reviewed this encounter.   Thad Ranger [place], NP 10/29/21 1330

## 2021-10-30 LAB — CERVICOVAGINAL ANCILLARY ONLY
Bacterial Vaginitis (gardnerella): POSITIVE — AB
Candida Glabrata: NEGATIVE
Candida Vaginitis: NEGATIVE
Chlamydia: NEGATIVE
Comment: NEGATIVE
Comment: NEGATIVE
Comment: NEGATIVE
Comment: NEGATIVE
Comment: NEGATIVE
Comment: NORMAL
Neisseria Gonorrhea: NEGATIVE
Trichomonas: NEGATIVE

## 2021-10-31 ENCOUNTER — Telehealth (HOSPITAL_COMMUNITY): Payer: Self-pay | Admitting: Emergency Medicine

## 2021-10-31 MED ORDER — METRONIDAZOLE 500 MG PO TABS: 500.0000 mg | ORAL_TABLET | Freq: Two times a day (BID) | ORAL | 0 refills | Status: DC

## 2022-05-30 ENCOUNTER — Encounter (HOSPITAL_COMMUNITY): Payer: Self-pay

## 2022-05-30 ENCOUNTER — Ambulatory Visit (HOSPITAL_COMMUNITY)
Admission: EM | Admit: 2022-05-30 | Discharge: 2022-05-30 | Disposition: A | Discharge status: Home / Self Care | Payer: Self-pay | Attending: Emergency Medicine | Admitting: Emergency Medicine

## 2022-05-30 ENCOUNTER — Telehealth (HOSPITAL_COMMUNITY): Payer: Self-pay | Admitting: Internal Medicine

## 2022-05-30 ENCOUNTER — Telehealth (HOSPITAL_COMMUNITY): Payer: Self-pay | Admitting: Emergency Medicine

## 2022-05-30 DIAGNOSIS — N939 Abnormal uterine and vaginal bleeding, unspecified: Secondary | ICD-10-CM | POA: Insufficient documentation

## 2022-05-30 LAB — CBC
HCT: 19.2 % — ABNORMAL LOW (ref 36.0–46.0)
Hemoglobin: 5.3 g/dL — CL (ref 12.0–15.0)
MCH: 18.7 pg — ABNORMAL LOW (ref 26.0–34.0)
MCHC: 27.6 g/dL — ABNORMAL LOW (ref 30.0–36.0)
MCV: 67.8 fL — ABNORMAL LOW (ref 80.0–100.0)
Platelets: 302 10*3/uL (ref 150–400)
RBC: 2.83 MIL/uL — ABNORMAL LOW (ref 3.87–5.11)
RDW: 20.4 % — ABNORMAL HIGH (ref 11.5–15.5)
WBC: 4.8 10*3/uL (ref 4.0–10.5)
nRBC: 0 % (ref 0.0–0.2)

## 2022-05-30 MED ORDER — MEDROXYPROGESTERONE ACETATE 10 MG PO TABS: 10.0000 mg | ORAL_TABLET | Freq: Every day | ORAL | 0 refills | Status: DC

## 2022-05-30 NOTE — ED Triage Notes (Signed)
Patient here today for vaginal bleeding X 1 year. Once a month, she is off for about a week. Whenever she is off her cycle, she is still having some discharge. She can here before and had blood work done. She has been taking iron supplements but still having bleeding. She has been dizzy for the past week.

## 2022-05-30 NOTE — ED Provider Notes (Signed)
[facility]    CSN: [id number] Arrival date & time: 05/30/22  1407      History   Chief Complaint Chief Complaint  Patient presents with   Vaginal Bleeding    HPI Joanne Schneider is a 32 y.o. female.   Patient presents for evaluation of persistent vaginal bleeding present for approximately 1 year.  Endorses that she has heavy menstrual bleeding 3 weeks out of 4 within a month.  Had initially was using pads but due to heaviness and leaking she has been using bath towels, typically uses 3/day.  During off week of menstruation, and endorses increased vaginal discharge, yellow in color without itching, odor or irritation.  Menses was irregular prior to heavy bleeding occurring, endorses use of Depo-Provera 3 to 4 years prior.  Sexually active, 1 partner, no condom use, no known exposure.  History of bacterial vaginosis but denies frequent recurrence.  Has been taking iron supplement daily but endorses over the past week she has begun to experience dizziness, lightheadedness and mild shortness of breath with exertion.  Denies abdominal pain or cramping, urinary symptoms, syncope.     History reviewed. No pertinent past medical history.  There are no problems to display for this patient.   Past Surgical History:  Procedure Laterality Date   CESAREAN SECTION      OB History   No obstetric history on file.      Home Medications    Prior to Admission medications   Medication Sig Start Date End Date Taking? Authorizing Provider  fluconazole (DIFLUCAN) 150 MG tablet Take 1 tablet (150 mg total) by mouth daily. 10/29/21   Vevelyn Francois, NP  metroNIDAZOLE (FLAGYL) 500 MG tablet Take 1 tablet (500 mg total) by mouth 2 (two) times daily. 10/31/21   Lamptey, Myrene Galas, MD    Family History Family History  Problem Relation Age of Onset   Diabetes Father    Cancer Sister     Social History Social History   Tobacco Use   Smoking status: Never   Smokeless tobacco: Never   Substance Use Topics   Alcohol use: Yes   Drug use: Yes    Types: Marijuana     Allergies   Shellfish allergy, Peanut butter flavor, and Percocet [oxycodone-acetaminophen]   Review of Systems Review of Systems Defer to HPI   Physical Exam Triage Vital Signs ED Triage Vitals  Enc Vitals Group     BP 05/30/22 1434 108/66     Pulse --      Resp 05/30/22 1434 16     Temp 05/30/22 1434 98.6 F (37 C)     Temp Source 05/30/22 1434 Oral     SpO2 05/30/22 1434 100 %     Weight 05/30/22 1433 147 lb (66.7 kg)     Height 05/30/22 1433 5\' 5"  (1.651 m)     Head Circumference --      Peak Flow --      Pain Score 05/30/22 1433 0     Pain Loc --      Pain Edu? --      Excl. in [place]? --    No data found.  Updated Vital Signs BP 108/66 (BP Location: Right Arm)   Temp 98.6 F (37 C) (Oral)   Resp 16   Ht 5\' 5"  (1.651 m)   Wt 147 lb (66.7 kg)   LMP 05/30/2022 (Exact Date)   SpO2 100%   BMI 24.46 kg/m   Visual Acuity Right  Eye Distance:   Left Eye Distance:   Bilateral Distance:    Right Eye Near:   Left Eye Near:    Bilateral Near:     Physical Exam Constitutional:      Appearance: Normal appearance.  Eyes:     Extraocular Movements: Extraocular movements intact.  Pulmonary:     Effort: Pulmonary effort is normal.  Genitourinary:    Comments: deferred Neurological:     Mental Status: She is alert and oriented to person, place, and time. Mental status is at baseline.      UC Treatments / Results  Labs (all labs ordered are listed, but only abnormal results are displayed) Labs Reviewed  CBC    EKG   Radiology No results found.  Procedures Procedures (including critical care time)  Medications Ordered in UC Medications - No data to display  Initial Impression / Assessment and Plan / UC Course  I have reviewed the triage vital signs and the nursing notes.  Pertinent labs & imaging results that were available during my care of the patient were  reviewed by me and considered in my medical decision making (see chart for details).  Abnormal vaginal bleeding  1.  CBC pending, will make adjustments to her iron supplement based on results, discussed with patient 2 STI labs are pending, advised abstinence until lab results, any treatment is complete, advised condom use during sexual encounters moving forward 3.  Provera 10 mg daily for 14 days prescribed in an attempt to swallow.  Bleeding 4.  Has upcoming appointment for physical with the local health department, also given information to [facility] for further evaluation as patient may have fibroids or cyst Final Clinical Impressions(s) / UC Diagnoses   Final diagnoses:  None   Discharge Instructions   None    ED Prescriptions   None    PDMP not reviewed this encounter.   Hans Eden, NP 05/30/22 1616    Hans Eden, NP 05/31/22 [phone]

## 2022-05-30 NOTE — Discharge Instructions (Signed)
Today you are evaluated for abnormal vaginal bleeding  Lab work has been obtained to check your blood levels, you will be notified of any concerning values, we may make adjustments to your iron tablets depending on the level, this will be discussed at time of notification  Please keep upcoming appointment with health department for further evaluation, you have also been given information to the [facility] who can complete additional workup to assess for causes of your bleeding such as fibroids and cyst  Begin Provera every morning for 14 days in efforts to help stop or slow your bleeding  At any point if you pass out please go to the nearest emergency department for immediate evaluation  Labs pending 2-3 days, you will be contacted if positive for any sti and treatment will be sent to the pharmacy, you will have to return to the clinic if positive for gonorrhea to receive treatment   Please refrain from having sex until labs results, if positive please refrain from having sex until treatment complete and symptoms resolve   If positive for Chlamydia  gonorrhea or trichomoniasis please notify partner or partners so they may tested as well  Moving forward, it is recommended you use some form of protection against the transmission of sti infections  such as condoms or dental dams with each sexual encounter

## 2022-05-30 NOTE — Telephone Encounter (Signed)
Lab called with critical value from today's CBC with hemoglobin 5.3. Joanne Petties, NP, patient's provider from today's visit, made aware and plans to call patient to have her go to the emergency department for further workup and evaluation immediately due to severe anemia.

## 2022-05-30 NOTE — Telephone Encounter (Signed)
Critical hemoglobin reported to Gwenette Greet NP, who noted this provider, attempted to reach patient to instruct to go to hospital, no answer left message to return call, attempted alternative contact who also did not answer

## 2022-05-31 ENCOUNTER — Telehealth: Payer: Self-pay | Admitting: Emergency Medicine

## 2022-05-31 ENCOUNTER — Emergency Department (HOSPITAL_COMMUNITY)
Admission: EM | Admit: 2022-05-31 | Discharge: 2022-05-31 | Disposition: A | Discharge status: Home / Self Care | Payer: Self-pay | Attending: Emergency Medicine | Admitting: Emergency Medicine

## 2022-05-31 ENCOUNTER — Emergency Department (HOSPITAL_COMMUNITY): Payer: Self-pay

## 2022-05-31 DIAGNOSIS — Z91010 Allergy to peanuts: Secondary | ICD-10-CM | POA: Insufficient documentation

## 2022-05-31 DIAGNOSIS — N920 Excessive and frequent menstruation with regular cycle: Secondary | ICD-10-CM

## 2022-05-31 DIAGNOSIS — D649 Anemia, unspecified: Secondary | ICD-10-CM

## 2022-05-31 DIAGNOSIS — R102 Pelvic and perineal pain: Secondary | ICD-10-CM | POA: Insufficient documentation

## 2022-05-31 LAB — COMPREHENSIVE METABOLIC PANEL
ALT: 15 U/L (ref 0–44)
AST: 24 U/L (ref 15–41)
Albumin: 3.9 g/dL (ref 3.5–5.0)
Alkaline Phosphatase: 47 U/L (ref 38–126)
Anion gap: 6 (ref 5–15)
BUN: 7 mg/dL (ref 6–20)
CO2: 22 mmol/L (ref 22–32)
Calcium: 8.4 mg/dL — ABNORMAL LOW (ref 8.9–10.3)
Chloride: 106 mmol/L (ref 98–111)
Creatinine, Ser: 0.78 mg/dL (ref 0.44–1.00)
GFR, Estimated: >60 mL/min (ref >60)
Glucose, Bld: 91 mg/dL (ref 70–99)
Potassium: 3.5 mmol/L (ref 3.5–5.1)
Sodium: 134 mmol/L — ABNORMAL LOW (ref 135–145)
Total Bilirubin: 0.5 mg/dL (ref 0.3–1.2)
Total Protein: 7.3 g/dL (ref 6.5–8.1)

## 2022-05-31 LAB — CBC WITH DIFFERENTIAL/PLATELET
Abs Immature Granulocytes: 0.01 10*3/uL (ref 0.00–0.07)
Basophils Absolute: 0 10*3/uL (ref 0.0–0.1)
Basophils Relative: 0 %
Eosinophils Absolute: 0.1 10*3/uL (ref 0.0–0.5)
Eosinophils Relative: 1 %
HCT: 19 % — ABNORMAL LOW (ref 36.0–46.0)
Hemoglobin: 5.3 g/dL — CL (ref 12.0–15.0)
Immature Granulocytes: 0 %
Lymphocytes Relative: 43 %
Lymphs Abs: 2.4 10*3/uL (ref 0.7–4.0)
MCH: 18.8 pg — ABNORMAL LOW (ref 26.0–34.0)
MCHC: 27.9 g/dL — ABNORMAL LOW (ref 30.0–36.0)
MCV: 67.4 fL — ABNORMAL LOW (ref 80.0–100.0)
Monocytes Absolute: 0.4 10*3/uL (ref 0.1–1.0)
Monocytes Relative: 7 %
Neutro Abs: 2.7 10*3/uL (ref 1.7–7.7)
Neutrophils Relative %: 49 %
Platelets: 354 10*3/uL (ref 150–400)
RBC: 2.82 MIL/uL — ABNORMAL LOW (ref 3.87–5.11)
RDW: 20.7 % — ABNORMAL HIGH (ref 11.5–15.5)
WBC: 5.6 10*3/uL (ref 4.0–10.5)
nRBC: 0 % (ref 0.0–0.2)

## 2022-05-31 LAB — PREPARE RBC (CROSSMATCH)

## 2022-05-31 LAB — HCG, QUANTITATIVE, PREGNANCY: hCG, Beta Chain, Quant, S: 1 m[IU]/mL (ref <5)

## 2022-05-31 LAB — ABO/RH: ABO/RH(D): A POS

## 2022-05-31 MED ORDER — MEGESTROL ACETATE 40 MG PO TABS: 120.0000 mg | ORAL_TABLET | Freq: Every day | ORAL | 0 refills | Status: AC

## 2022-05-31 MED ORDER — SODIUM CHLORIDE 0.9% IV SOLUTION: Freq: Once | INTRAVENOUS | Status: AC

## 2022-05-31 NOTE — Discharge Instructions (Signed)
Please take your medications as prescribed. I recommend close follow-up with OB/GYN for reevaluation.  Please do not hesitate to return to emergency department if worrisome signs symptoms we discussed become apparent.

## 2022-05-31 NOTE — Telephone Encounter (Signed)
Has critical hemoglobin level of 5.3, has attempted to call, this is second attempt,  no answer, left message, sent MyChart message yesterday evening with instructions to go to the nearest emergency department

## 2022-05-31 NOTE — ED Provider Notes (Signed)
[place] EMERGENCY DEPARTMENT AT [facility] Provider Note   CSN: [id number] Arrival date & time: 05/31/22  1148     History  No chief complaint on file.   Joanne Schneider is a 32 y.o. female presents for evaluation of vaginal bleeding.  Patient states she has had heavy menstrual bleeding 3 weeks out for weeks within a month in the last 6 months.  Initially she was using pads but due to heavy bleeding she has been using bath towels.  She reports increased yellowish vaginal discharge.  Denies itching and burning with urination.  Menses was irregular prior to heavy bleeding occur.  Patient is sexually active.  Denies abdominal pain or cramping, urinary symptoms, syncope.  Denies fever, nausea, vomiting.  HPI     Home Medications Prior to Admission medications   Medication Sig Start Date End Date Taking? Authorizing Provider  megestrol (MEGACE) 40 MG tablet Take 3 tablets (120 mg total) by mouth daily. 05/31/22 06/30/22 Yes Rex Kras, PA  fluconazole (DIFLUCAN) 150 MG tablet Take 1 tablet (150 mg total) by mouth daily. 10/29/21   Vevelyn Francois, NP  medroxyPROGESTERone (PROVERA) 10 MG tablet Take 1 tablet (10 mg total) by mouth daily. 05/30/22   White, Leitha Schuller, NP  metroNIDAZOLE (FLAGYL) 500 MG tablet Take 1 tablet (500 mg total) by mouth 2 (two) times daily. 10/31/21   Lamptey, Myrene Galas, MD      Allergies    Shellfish allergy, Peanut butter flavor, and Percocet [oxycodone-acetaminophen]    Review of Systems   Review of Systems Negative except as per HPI.  Physical Exam Updated Vital Signs BP 107/75   Pulse 73   Temp 98.1 F (36.7 C) (Oral)   Resp 19   LMP 05/30/2022 (Exact Date)   SpO2 100%  Physical Exam Vitals and nursing note reviewed.  Constitutional:      Appearance: Normal appearance.  HENT:     Head: Normocephalic and atraumatic.     Mouth/Throat:     Mouth: Mucous membranes are moist.  Eyes:     General: No scleral icterus. Cardiovascular:     Rate  and Rhythm: Normal rate and regular rhythm.     Pulses: Normal pulses.     Heart sounds: Normal heart sounds.  Pulmonary:     Effort: Pulmonary effort is normal.     Breath sounds: Normal breath sounds.  Abdominal:     General: Abdomen is flat.     Palpations: Abdomen is soft.     Tenderness: There is no abdominal tenderness.  Genitourinary:    Comments: Pelvic Exam with Female Chaperone: External: Normal Vaginal: Moderate amount of blood with yellowish discharge. Bimanual: Negative Musculoskeletal:        General: No deformity.  Skin:    General: Skin is warm.     Findings: No rash.  Neurological:     General: No focal deficit present.     Mental Status: She is alert.  Psychiatric:        Mood and Affect: Mood normal.     ED Results / Procedures / Treatments   Labs (all labs ordered are listed, but only abnormal results are displayed) Labs Reviewed  COMPREHENSIVE METABOLIC PANEL - Abnormal; Notable for the following components:      Result Value   Sodium 134 (*)    Calcium 8.4 (*)    All other components within normal limits  CBC WITH DIFFERENTIAL/PLATELET - Abnormal; Notable for the following components:   RBC  2.82 (*)    Hemoglobin 5.3 (*)    HCT 19.0 (*)    MCV 67.4 (*)    MCH 18.8 (*)    MCHC 27.9 (*)    RDW 20.7 (*)    All other components within normal limits  HCG, QUANTITATIVE, PREGNANCY  TYPE AND SCREEN  ABO/RH  PREPARE RBC (CROSSMATCH)    EKG None  Radiology US Pelvis Complete  Result Date: 05/31/2022 CLINICAL DATA:  Excessive bleeding EXAM: TRANSABDOMINAL AND TRANSVAGINAL ULTRASOUND OF PELVIS TECHNIQUE: Both transabdominal and transvaginal ultrasound examinations of the pelvis were performed. Transabdominal technique was performed for global imaging of the pelvis including uterus, ovaries, adnexal regions, and pelvic cul-de-sac. It was necessary to proceed with endovaginal exam following the transabdominal exam to visualize the ovaries. COMPARISON:   None Available. FINDINGS: Uterus Measurements: 8.0 x 4.3 x 5.1 cm = volume: 92.3 mL. There is a heterogeneous round lesion within the lower uterine segment measuring 4.2 x 3.0 x 3.6 cm, with is knee shin shadowing. This demonstrates peripheral and internal vascularity. There are some internal hypoechoic areas. Endometrium Thickness: 5.0 mm, normal. Right ovary Measurements: 3.7 x 2.1 x 2.3 cm = volume: 9.3 mL. Normal appearance/no adnexal mass. Left ovary Measurements: 3.6 x 1.8 x 1.8 cm = volume: 5.9 mL. Normal appearance/no adnexal mass. Other findings No abnormal free fluid. IMPRESSION: Heterogeneous mass within the lower uterine segment measuring 4.2 x 3.0 x 3.6 cm, with internal and peripheral vascularity. This is favored to represent a submucosal fibroid, versus an adenomyoma. Normal ovaries. Electronically Signed   By: Maurine Simmering M.D.   On: 05/31/2022 13:39   US Transvaginal Non-OB  Result Date: 05/31/2022 CLINICAL DATA:  Excessive bleeding EXAM: TRANSABDOMINAL AND TRANSVAGINAL ULTRASOUND OF PELVIS TECHNIQUE: Both transabdominal and transvaginal ultrasound examinations of the pelvis were performed. Transabdominal technique was performed for global imaging of the pelvis including uterus, ovaries, adnexal regions, and pelvic cul-de-sac. It was necessary to proceed with endovaginal exam following the transabdominal exam to visualize the ovaries. COMPARISON:  None Available. FINDINGS: Uterus Measurements: 8.0 x 4.3 x 5.1 cm = volume: 92.3 mL. There is a heterogeneous round lesion within the lower uterine segment measuring 4.2 x 3.0 x 3.6 cm, with is knee shin shadowing. This demonstrates peripheral and internal vascularity. There are some internal hypoechoic areas. Endometrium Thickness: 5.0 mm, normal. Right ovary Measurements: 3.7 x 2.1 x 2.3 cm = volume: 9.3 mL. Normal appearance/no adnexal mass. Left ovary Measurements: 3.6 x 1.8 x 1.8 cm = volume: 5.9 mL. Normal appearance/no adnexal mass. Other  findings No abnormal free fluid. IMPRESSION: Heterogeneous mass within the lower uterine segment measuring 4.2 x 3.0 x 3.6 cm, with internal and peripheral vascularity. This is favored to represent a submucosal fibroid, versus an adenomyoma. Normal ovaries. Electronically Signed   By: Maurine Simmering M.D.   On: 05/31/2022 13:39    Procedures Procedures    Medications Ordered in ED Medications  0.9 %  sodium chloride infusion (Manually program via Guardrails IV Fluids) (has no administration in time range)  0.9 %  sodium chloride infusion (Manually program via Guardrails IV Fluids) ( Intravenous New Bag/Given 05/31/22 1634)    ED Course/ Medical Decision Making/ A&P                             Medical Decision Making Amount and/or Complexity of Data Reviewed Labs: ordered.  Risk Prescription drug management.   This patient presents to  the ED for anemia and vaginal bleeding, this involves an extensive number of treatment options, and is a complaint that carries with a high risk of complications and morbidity.  The differential diagnosis includes fibroids, ectopic pregnancy, molar pregnancy, coagulopathy, trauma.  This is not an exhaustive list.  Lab tests: I ordered and personally interpreted labs.  The pertinent results include: WBC unremarkable. Hbg unremarkable. Platelets unremarkable. Electrolytes unremarkable. BUN, creatinine unremarkable. Heterogeneous mass within the lower uterine segment measuring 4.2 x 3.0 x 3.6 cm, with internal and peripheral vascularity. This is favored to represent a submucosal fibroid, versus an adenomyoma. Normal ovaries.  Imaging studies: I ordered imaging studies. I personally reviewed, interpreted imaging and agree with the radiologist's interpretations. The results include: Heterogeneous mass within the lower uterine segment measuring 4.2 x 3.0 x 3.6 cm, with internal and peripheral vascularity. This is favored to represent a submucosal fibroid, versus an  adenomyoma. Normal ovaries.   Problem list/ ED course/ Critical interventions/ Medical management: HPI: See above Vital signs within normal range and stable throughout visit. Laboratory/imaging studies significant for: See above. On physical examination, patient is afebrile and appears in no acute distress. Patient presents with vaginal bleeding likely secondary to fibroids or other non-emergent cause of abnormal uterine bleeding such as anovulatory cycle. Based on History, Exam, and ED Workup patient's presentation not consistent with ectopic pregnancy, molar pregnancy, life-threatening coagulopathy, trauma, serious bacterial infection.  Patient with hemoglobin 5.3.  2 unit of blood ordered.  Patient given megestrol and will follow up with OBGYN.  I have reviewed the patient home medicines and have made adjustments as needed.  Cardiac monitoring/EKG: The patient was maintained on a cardiac monitor.  I personally reviewed and interpreted the cardiac monitor which showed an underlying rhythm of: sinus rhythm.  Additional history obtained: External records from outside source obtained and reviewed including: Chart review including previous notes, labs, imaging.  Consultations obtained: I requested consultation with Dr. Elonda Husky, and discussed lab and imaging findings as well as pertinent plan.  He recommended 2 units of blood transfusion, megestrol prescription and outpatient follow-up with OB/GYN.  Disposition Continued outpatient therapy. Follow-up with OB-GYN recommended for reevaluation of symptoms. Treatment plan discussed with patient.  Pt acknowledged understanding was agreeable to the plan. Worrisome signs and symptoms were discussed with patient, and patient acknowledged understanding to return to the ED if they noticed these signs and symptoms. Patient was stable upon discharge.   This chart was dictated using voice recognition software.  Despite best efforts to proofread,  errors can occur  which can change the documentation meaning.          Final Clinical Impression(s) / ED Diagnoses Final diagnoses:  Anemia, unspecified type  Menorrhagia with regular cycle    Rx / DC Orders ED Discharge Orders          Ordered    megestrol (MEGACE) 40 MG tablet  Daily        05/31/22 1706              Rex Kras, [place] 05/31/22 1830    Dorie Rank, MD 06/01/22 [phone]

## 2022-05-31 NOTE — ED Triage Notes (Signed)
Patient complaining of vaginal bleeding, seen yesterday at [facility] for bleeding and had bloodwork done and they called patient regarding her low Hemoglobin and told her to go to the ER. Heavy bleeding for over 6 months, stops for a day or two then starts back per patient.  Hemoglobin 5.3 yesterday.

## 2022-05-31 NOTE — ED Provider Triage Note (Signed)
Emergency Medicine Provider Triage Evaluation Note  Joanne Schneider , a 32 y.o. female  was evaluated in triage.  Pt complains of heavy menstrual cycles, excessive vaginal bleeding.  Heavy menstrual cycles have been present for the majority of her life.  Got worse 6 to 7 months ago.  Now complaining of fatigue, dizziness, dyspnea on exertion.  Presented to urgent care yesterday and was found to have a hemoglobin of 5.3.  Review of Systems  Positive: As above Negative: As above  Physical Exam  BP 121/73 (BP Location: Left Arm)   Pulse 88   Temp 98.1 F (36.7 C) (Oral)   Resp 16   LMP 05/30/2022 (Exact Date)   SpO2 100%  Gen:   Awake, no distress   Resp:  Normal effort  MSK:   Moves extremities without difficulty  Other:    Medical Decision Making  Medically screening exam initiated at 12:24 PM.  Appropriate orders placed.  Joanne Schneider was informed that the remainder of the evaluation will be completed by another provider, this initial triage assessment does not replace that evaluation, and the importance of remaining in the ED until their evaluation is complete.  Labs and ultrasound ordered   Joanne Schneider, Joanne Schneider 05/31/22 1225

## 2022-06-01 LAB — CERVICOVAGINAL ANCILLARY ONLY
Bacterial Vaginitis (gardnerella): NEGATIVE
Candida Glabrata: NEGATIVE
Candida Vaginitis: NEGATIVE
Chlamydia: NEGATIVE
Comment: NEGATIVE
Comment: NEGATIVE
Comment: NEGATIVE
Comment: NEGATIVE
Comment: NEGATIVE
Comment: NORMAL
Neisseria Gonorrhea: NEGATIVE
Trichomonas: NEGATIVE

## 2022-06-02 LAB — TYPE AND SCREEN
ABO/RH(D): A POS
Antibody Screen: NEGATIVE
Unit division: 0
Unit division: 0

## 2022-06-02 LAB — BPAM RBC
Blood Product Expiration Date: 202404112359
Blood Product Expiration Date: 202404122359
ISSUE DATE / TIME: 202403171627
ISSUE DATE / TIME: 202403171933
Unit Type and Rh: 6200
Unit Type and Rh: 6200

## 2022-06-11 ENCOUNTER — Other Ambulatory Visit: Payer: Self-pay | Admitting: Obstetrics and Gynecology

## 2022-06-11 DIAGNOSIS — Z803 Family history of malignant neoplasm of breast: Secondary | ICD-10-CM

## 2022-07-19 ENCOUNTER — Other Ambulatory Visit: Payer: Self-pay

## 2022-07-19 ENCOUNTER — Encounter (HOSPITAL_COMMUNITY): Payer: Self-pay

## 2022-07-19 ENCOUNTER — Emergency Department (HOSPITAL_COMMUNITY)
Admission: EM | Admit: 2022-07-19 | Discharge: 2022-07-20 | Disposition: A | Discharge status: Home / Self Care | Payer: Self-pay | Attending: Emergency Medicine | Admitting: Emergency Medicine

## 2022-07-19 ENCOUNTER — Emergency Department (HOSPITAL_COMMUNITY): Payer: Self-pay

## 2022-07-19 DIAGNOSIS — Z91010 Allergy to peanuts: Secondary | ICD-10-CM | POA: Insufficient documentation

## 2022-07-19 DIAGNOSIS — D259 Leiomyoma of uterus, unspecified: Secondary | ICD-10-CM

## 2022-07-19 DIAGNOSIS — D500 Iron deficiency anemia secondary to blood loss (chronic): Secondary | ICD-10-CM | POA: Insufficient documentation

## 2022-07-19 DIAGNOSIS — R109 Unspecified abdominal pain: Secondary | ICD-10-CM | POA: Insufficient documentation

## 2022-07-19 DIAGNOSIS — N939 Abnormal uterine and vaginal bleeding, unspecified: Secondary | ICD-10-CM

## 2022-07-19 DIAGNOSIS — D62 Acute posthemorrhagic anemia: Secondary | ICD-10-CM

## 2022-07-19 LAB — CBC WITH DIFFERENTIAL/PLATELET
Abs Immature Granulocytes: 0.01 10*3/uL (ref 0.00–0.07)
Basophils Absolute: 0 10*3/uL (ref 0.0–0.1)
Basophils Relative: 0 %
Eosinophils Absolute: 0.1 10*3/uL (ref 0.0–0.5)
Eosinophils Relative: 1 %
HCT: 20.1 % — ABNORMAL LOW (ref 36.0–46.0)
Hemoglobin: 5.5 g/dL — CL (ref 12.0–15.0)
Immature Granulocytes: 0 %
Lymphocytes Relative: 37 %
Lymphs Abs: 2.5 10*3/uL (ref 0.7–4.0)
MCH: 19.2 pg — ABNORMAL LOW (ref 26.0–34.0)
MCHC: 27.4 g/dL — ABNORMAL LOW (ref 30.0–36.0)
MCV: 70.3 fL — ABNORMAL LOW (ref 80.0–100.0)
Monocytes Absolute: 0.5 10*3/uL (ref 0.1–1.0)
Monocytes Relative: 7 %
Neutro Abs: 3.7 10*3/uL (ref 1.7–7.7)
Neutrophils Relative %: 55 %
Platelets: 599 10*3/uL — ABNORMAL HIGH (ref 150–400)
RBC: 2.86 MIL/uL — ABNORMAL LOW (ref 3.87–5.11)
RDW: 22.5 % — ABNORMAL HIGH (ref 11.5–15.5)
WBC: 6.8 10*3/uL (ref 4.0–10.5)
nRBC: 0 % (ref 0.0–0.2)

## 2022-07-19 LAB — URINALYSIS, ROUTINE W REFLEX MICROSCOPIC
Bacteria, UA: NONE SEEN
Bilirubin Urine: NEGATIVE
Glucose, UA: NEGATIVE mg/dL
Ketones, ur: NEGATIVE mg/dL
Nitrite: NEGATIVE
Protein, ur: 100 mg/dL — AB
RBC / HPF: >50 RBC/hpf (ref 0–5)
Specific Gravity, Urine: 1.024 (ref 1.005–1.030)
pH: 6 (ref 5.0–8.0)

## 2022-07-19 LAB — BASIC METABOLIC PANEL
Anion gap: 6 (ref 5–15)
BUN: 7 mg/dL (ref 6–20)
CO2: 23 mmol/L (ref 22–32)
Calcium: 8.6 mg/dL — ABNORMAL LOW (ref 8.9–10.3)
Chloride: 107 mmol/L (ref 98–111)
Creatinine, Ser: 0.79 mg/dL (ref 0.44–1.00)
GFR, Estimated: >60 mL/min (ref >60)
Glucose, Bld: 102 mg/dL — ABNORMAL HIGH (ref 70–99)
Potassium: 3.5 mmol/L (ref 3.5–5.1)
Sodium: 136 mmol/L (ref 135–145)

## 2022-07-19 LAB — TYPE AND SCREEN

## 2022-07-19 LAB — PREPARE RBC (CROSSMATCH)

## 2022-07-19 LAB — PREGNANCY, URINE: Preg Test, Ur: NEGATIVE

## 2022-07-19 MED ORDER — ACETAMINOPHEN 325 MG PO TABS
650.0000 mg | ORAL_TABLET | Freq: Once | ORAL | Status: AC
  Administered 2022-07-19: 650 mg via ORAL
  Filled 2022-07-19: qty 2

## 2022-07-19 MED ORDER — MEGESTROL ACETATE 40 MG PO TABS: ORAL_TABLET | ORAL | 0 refills | Status: AC

## 2022-07-19 MED ORDER — SODIUM CHLORIDE 0.9% IV SOLUTION: Freq: Once | INTRAVENOUS | Status: AC

## 2022-07-19 NOTE — Discharge Instructions (Addendum)
Please follow-up outpatient with OB/GYN.  You will be restarted on Megace.  You are resuscitated with 2 units of blood due to your heavy vaginal bleeding which is likely due to a uterine fibroid.

## 2022-07-19 NOTE — ED Triage Notes (Addendum)
Patient has had a 5 day migraine along with heavy vaginal bleeding with blood clots the size of her palm. Patient said she uses towels instead of pads. And goes through 4 towels a day.

## 2022-07-19 NOTE — ED Provider Notes (Signed)
[place] EMERGENCY DEPARTMENT AT [facility] Provider Note   CSN: [id number] Arrival date & time: 07/19/22  1707     History  Chief Complaint  Patient presents with   Headache   Vaginal Bleeding    Joanne Schneider is a 32 y.o. female.   Headache Associated symptoms: fatigue   Vaginal Bleeding Associated symptoms: fatigue      31 year old female presenting to the emerged department with heavy vaginal bleeding and symptoms of anemia.  The patient states that she was seen 1 month ago for the same.  She had an ultrasound at that time which showed a uterine fibroid likely.  She was started on Megace and advised to follow-up outpatient with OB/GYN.  She states that she followed outpatient with the health department but did not follow-up with OB/GYN.  She states that her vaginal bleeding improved while on Megace however around the time of her most recent.  She has also had heavy bleeding.  She endorses passage of multiple large fist sized clots every day for the past week.  She endorses lower abdominal discomfort.  She feels lightheaded and mildly fatigued and short of breath.  She needed a blood transfusion during her recent ER visit.  Home Medications Prior to Admission medications   Medication Sig Start Date End Date Taking? Authorizing Provider  megestrol (MEGACE) 40 MG tablet Take 3 tablets (120 mg total) by mouth daily for 5 days, THEN 2 tablets (80 mg total) daily for 5 days, THEN 1 tablet (40 mg total) daily for 20 days. 07/19/22 08/18/22 Yes Ernie Avena, MD  fluconazole (DIFLUCAN) 150 MG tablet Take 1 tablet (150 mg total) by mouth daily. 10/29/21   Barbette Merino, NP  metroNIDAZOLE (FLAGYL) 500 MG tablet Take 1 tablet (500 mg total) by mouth 2 (two) times daily. 10/31/21   Lamptey, Britta Mccreedy, MD      Allergies    Shellfish allergy, Peanut butter flavor, and Percocet [oxycodone-acetaminophen]    Review of Systems   Review of Systems  Constitutional:  Positive for  fatigue.  Genitourinary:  Positive for vaginal bleeding.  Neurological:  Positive for light-headedness and headaches.  All other systems reviewed and are negative.   Physical Exam Updated Vital Signs BP 120/67   Pulse 74   Temp 98.5 F (36.9 C)   Resp 16   Ht 5\' 5"  (1.651 m)   Wt 63.5 kg   SpO2 100%   BMI 23.30 kg/m  Physical Exam Vitals and nursing note reviewed.  Constitutional:      General: She is not in acute distress.    Appearance: She is well-developed.  HENT:     Head: Normocephalic and atraumatic.  Eyes:     Conjunctiva/sclera: Conjunctivae normal.  Cardiovascular:     Rate and Rhythm: Normal rate and regular rhythm.     Heart sounds: No murmur heard. Pulmonary:     Effort: Pulmonary effort is normal. No respiratory distress.     Breath sounds: Normal breath sounds.  Abdominal:     Palpations: Abdomen is soft.     Tenderness: There is no abdominal tenderness.  Genitourinary:    Comments: GI exam deferred today Musculoskeletal:        General: No swelling.     Cervical back: Neck supple.  Skin:    General: Skin is warm and dry.     Capillary Refill: Capillary refill takes less than 2 seconds.  Neurological:     Mental Status: She is alert.  Psychiatric:        Mood and Affect: Mood normal.     ED Results / Procedures / Treatments   Labs (all labs ordered are listed, but only abnormal results are displayed) Labs Reviewed  CBC WITH DIFFERENTIAL/PLATELET - Abnormal; Notable for the following components:      Result Value   RBC 2.86 (*)    Hemoglobin 5.5 (*)    HCT 20.1 (*)    MCV 70.3 (*)    MCH 19.2 (*)    MCHC 27.4 (*)    RDW 22.5 (*)    Platelets 599 (*)    All other components within normal limits  BASIC METABOLIC PANEL - Abnormal; Notable for the following components:   Glucose, Bld 102 (*)    Calcium 8.6 (*)    All other components within normal limits  URINALYSIS, ROUTINE W REFLEX MICROSCOPIC - Abnormal; Notable for the following  components:   APPearance CLOUDY (*)    Hgb urine dipstick LARGE (*)    Protein, ur 100 (*)    Leukocytes,Ua SMALL (*)    All other components within normal limits  PREGNANCY, URINE  PREPARE RBC (CROSSMATCH)  TYPE AND SCREEN    EKG None  Radiology US Pelvis Complete  Result Date: 07/19/2022 CLINICAL DATA:  Initial evaluation for acute pelvic pain. EXAM: TRANSABDOMINAL AND TRANSVAGINAL ULTRASOUND OF PELVIS TECHNIQUE: Both transabdominal and transvaginal ultrasound examinations of the pelvis were performed. Transabdominal technique was performed for global imaging of the pelvis including uterus, ovaries, adnexal regions, and pelvic cul-de-sac. It was necessary to proceed with endovaginal exam following the transabdominal exam to visualize the endometrium. COMPARISON:  Prior study from 05/31/2022. FINDINGS: Uterus Measurements: 9.6 x 4.1 x 5.6 cm = volume: 114.7 mL. Uterus is anteverted. 5.8 x 2.9 x 4.2 cm submucosal fibroid seen positioned at the lower uterine segment. Endometrium Thickness: 5.3 mm.  No focal abnormality visualized. Right ovary Not visualized.  No adnexal mass. Left ovary Measurements: 3.5 x 3.4 x 3.1 cm = volume: 19.6 mL. 2.6 cm dominant follicle noted. No other adnexal mass. Other findings Trace free fluid present within the pelvis. IMPRESSION: 1. 5.8 cm submucosal fibroid at the lower uterine segment. 2. Endometrial stripe within normal limits measuring 5.3 mm in thickness. If bleeding remains unresponsive to hormonal or medical therapy, sonohysterogram should be considered for focal lesion work-up. (Ref: Radiological Reasoning: Algorithmic Workup of Abnormal Vaginal Bleeding with Endovaginal Sonography and Sonohysterography. AJR 2008; 161:W96-04). 3. 2.6 cm dominant left ovarian follicle with associated trace free fluid within the pelvis. 4. Nonvisualization of the right ovary.  No other adnexal mass. Electronically Signed   By: Rise Mu M.D.   On: 07/19/2022 19:16    US Transvaginal Non-OB  Result Date: 07/19/2022 CLINICAL DATA:  Initial evaluation for acute pelvic pain. EXAM: TRANSABDOMINAL AND TRANSVAGINAL ULTRASOUND OF PELVIS TECHNIQUE: Both transabdominal and transvaginal ultrasound examinations of the pelvis were performed. Transabdominal technique was performed for global imaging of the pelvis including uterus, ovaries, adnexal regions, and pelvic cul-de-sac. It was necessary to proceed with endovaginal exam following the transabdominal exam to visualize the endometrium. COMPARISON:  Prior study from 05/31/2022. FINDINGS: Uterus Measurements: 9.6 x 4.1 x 5.6 cm = volume: 114.7 mL. Uterus is anteverted. 5.8 x 2.9 x 4.2 cm submucosal fibroid seen positioned at the lower uterine segment. Endometrium Thickness: 5.3 mm.  No focal abnormality visualized. Right ovary Not visualized.  No adnexal mass. Left ovary Measurements: 3.5 x 3.4 x 3.1 cm = volume: 19.6  mL. 2.6 cm dominant follicle noted. No other adnexal mass. Other findings Trace free fluid present within the pelvis. IMPRESSION: 1. 5.8 cm submucosal fibroid at the lower uterine segment. 2. Endometrial stripe within normal limits measuring 5.3 mm in thickness. If bleeding remains unresponsive to hormonal or medical therapy, sonohysterogram should be considered for focal lesion work-up. (Ref: Radiological Reasoning: Algorithmic Workup of Abnormal Vaginal Bleeding with Endovaginal Sonography and Sonohysterography. AJR 2008; 960:A54-09). 3. 2.6 cm dominant left ovarian follicle with associated trace free fluid within the pelvis. 4. Nonvisualization of the right ovary.  No other adnexal mass. Electronically Signed   By: Rise Mu M.D.   On: 07/19/2022 19:16    Procedures .Critical Care  Performed by: Ernie Avena, MD Authorized by: Ernie Avena, MD   Critical care provider statement:    Critical care time (minutes):  30   Critical care was time spent personally by me on the following activities:   Development of treatment plan with patient or surrogate, discussions with consultants, evaluation of patient's response to treatment, examination of patient, ordering and review of laboratory studies, ordering and review of radiographic studies, ordering and performing treatments and interventions, pulse oximetry, re-evaluation of patient's condition and review of old charts     Medications Ordered in ED Medications  0.9 %  sodium chloride infusion (Manually program via Guardrails IV Fluids) (has no administration in time range)  acetaminophen (TYLENOL) tablet 650 mg (650 mg Oral Given 07/19/22 2014)    ED Course/ Medical Decision Making/ A&P Clinical Course as of 07/19/22 2337  Sun Jul 19, 2022  2051 Hemoglobin(!!): 5.5 [JL]    Clinical Course User Index [JL] Ernie Avena, MD                             Medical Decision Making Amount and/or Complexity of Data Reviewed Labs: ordered. Decision-making details documented in ED Course.  Risk OTC drugs. Prescription drug management.    32 year old female presenting to the emerged department with heavy vaginal bleeding and symptoms of anemia.  The patient states that she was seen 1 month ago for the same.  She had an ultrasound at that time which showed a uterine fibroid likely.  She was started on Megace and advised to follow-up outpatient with OB/GYN.  She states that she followed outpatient with the health department but did not follow-up with OB/GYN.  She states that her vaginal bleeding improved while on Megace however around the time of her most recent.  She has also had heavy bleeding.  She endorses passage of multiple large fist sized clots every day for the past week.  She endorses lower abdominal discomfort.  She feels lightheaded and mildly fatigued and short of breath.  She needed a blood transfusion during her recent ER visit.  On arrival, the patient was vitally stable, afebrile, not tachycardic or tachypneic, BP 135/77,  saturating her percent on room air.  Physical exam concerning for an abdomen that was soft, nontender, nondistended, no rebound or guarding.  Patient presenting with symptoms of anemia in the setting of heavy vaginal bleeding.  Repeat ultrasound revealed the presence of a large uterine fibroid: IMPRESSION:  1. 5.8 cm submucosal fibroid at the lower uterine segment.  2. Endometrial stripe within normal limits measuring 5.3 mm in  thickness. If bleeding remains unresponsive to hormonal or medical  therapy, sonohysterogram should be considered for focal lesion  work-up. (Ref: Radiological Reasoning: Algorithmic Workup of  Abnormal Vaginal Bleeding with Endovaginal Sonography and  Sonohysterography. AJR 2008; 161:W96-04).  3. 2.6 cm dominant left ovarian follicle with associated trace free  fluid within the pelvis.  4. Nonvisualization of the right ovary.  No other adnexal mass.   Laboratory evaluation significant for CBC with anemia to 5.5, BMP unremarkable, urinalysis with hematuria, negative for UTI, urine pregnancy negative.  Type and screen was ordered and the patient was consented for 2 units PRBC transfusion.  Spoke with Dr. Myna Hidalgo, who states that she will help to arrange for follow-up for the patient.  Agreed with plan for blood transfusion, agreed for reinitiating Megace.  Patient pending blood transfusion at time of signout.  Will plan for outpatient follow-up with OB/GYN with treatment with Megace per OB/GYN, 5 tablets for 5 days, 2 tablets for 5 days can 1 tablet daily.  [facility] and this provided in the discharge instructions.  Signout given to Dr. Madilyn Hook pending transfusion and reassessment.   Final Clinical Impression(s) / ED Diagnoses Final diagnoses:  Vaginal bleeding  Uterine leiomyoma, unspecified location  Acute blood loss anemia    Rx / DC Orders ED Discharge Orders          Ordered     megestrol (MEGACE) 40 MG tablet  Daily        07/19/22 2337              Ernie Avena, MD 07/19/22 2338

## 2022-07-19 NOTE — ED Notes (Signed)
Blood consent signed

## 2022-07-19 NOTE — ED Provider Triage Note (Signed)
Emergency Medicine Provider Triage Evaluation Note  Joanne Schneider , a 32 y.o. female  was evaluated in triage.  Patient concern for anemia.  Reports that for the past 10 to 14 days she has been bleeding and passing very large clots "as big as her hand."  Has needed blood transfusion in the past.  Reports feeling weak and lightheaded. Review of Systems  Positive:  Negative:   Physical Exam  BP 135/77 (BP Location: Left Arm)   Pulse 93   Temp 98.5 F (36.9 C) (Oral)   Resp 15   Ht 5\' 5"  (1.651 m)   Wt 63.5 kg   SpO2 100%   BMI 23.30 kg/m  Gen:   Awake, no distress   Resp:  Normal effort  MSK:   Moves extremities without difficulty  Other:    Medical Decision Making  Medically screening exam initiated at 5:37 PM.  Appropriate orders placed.  Aniiya Tracy was informed that the remainder of the evaluation will be completed by another provider, this initial triage assessment does not replace that evaluation, and the importance of remaining in the ED until their evaluation is complete.     Saddie Benders, [place] 07/19/22 1737

## 2022-07-20 LAB — TYPE AND SCREEN
ABO/RH(D): A POS
Antibody Screen: POSITIVE

## 2022-07-20 MED ORDER — METOCLOPRAMIDE HCL 5 MG/ML IJ SOLN
10.0000 mg | Freq: Once | INTRAMUSCULAR | Status: AC
  Administered 2022-07-20: 10 mg via INTRAVENOUS
  Filled 2022-07-20: qty 2

## 2022-07-20 NOTE — ED Provider Notes (Signed)
Care assumed at 2300.  Patient with history of menorrhagia here for evaluation of vaginal bleeding.  She is found to be anemic.  Initial provider discussed with OB/GYN.  We will start on Megace and transfused 2 units PRBCs with outpatient OB/GYN follow-up.  Care assumed pending blood transfusion.  Patient without issues with transfusion.  Plan to discharge with outpatient follow-up.   Tilden Fossa, MD 07/20/22 [phone]

## 2022-07-21 LAB — BPAM RBC
Blood Product Expiration Date: 202405282359
Blood Product Expiration Date: 202405282359
ISSUE DATE / TIME: 202405060012
ISSUE DATE / TIME: 202405060229
Unit Type and Rh: 6200
Unit Type and Rh: 6200

## 2022-07-21 LAB — TYPE AND SCREEN
Unit division: 0
Unit division: 0

## 2022-07-23 ENCOUNTER — Inpatient Hospital Stay: Admission: RE | Admit: 2022-07-23 | Payer: Self-pay | Source: Ambulatory Visit

## 2022-07-23 ENCOUNTER — Telehealth: Payer: Self-pay | Admitting: Family Medicine

## 2022-07-23 ENCOUNTER — Ambulatory Visit: Payer: Self-pay

## 2022-07-23 NOTE — Telephone Encounter (Signed)
-----   Message from Myna Hidalgo, DO sent at 07/19/2022 10:33 PM EDT ----- Regarding: follow up Pt seen in ER due to symptomatic anemia with fibroids  Please schedule follow up with MD to discuss management.  Thanks!!

## 2022-07-23 NOTE — Telephone Encounter (Signed)
Spoke with patient about her appointment. She has requested to be seen sooner. I explained to her we have scheduled for the first available appointment. I also explained that sometimes there could be a cancellation and she could request to be seen. The quickest way is to send a message through her MyChart. Patient stated she would do that.

## 2022-09-01 ENCOUNTER — Other Ambulatory Visit: Payer: Self-pay

## 2022-09-01 ENCOUNTER — Ambulatory Visit (INDEPENDENT_AMBULATORY_CARE_PROVIDER_SITE_OTHER): Payer: Self-pay | Admitting: Obstetrics and Gynecology

## 2022-09-01 VITALS — BP 120/75 | HR 91 | Wt 151.1 lb

## 2022-09-01 DIAGNOSIS — N939 Abnormal uterine and vaginal bleeding, unspecified: Secondary | ICD-10-CM

## 2022-09-01 MED ORDER — MEGESTROL ACETATE 40 MG PO TABS
40.0000 mg | ORAL_TABLET | Freq: Two times a day (BID) | ORAL | 5 refills | Status: DC
Start: 2022-09-01 — End: 2023-02-25

## 2022-09-01 NOTE — Patient Instructions (Addendum)
Sonata procedure - done through the vagina to shrink the fibroids over time  Home - Sonata Treatment  Laparoscopic myomectomy - removal of the fibroid (recovery is about 6-8 weeks; done with small incisions) Uterine artery embolization  - block the blood supply to uterus (but not a good idea if you want to get pregnant in the future)

## 2022-09-01 NOTE — Progress Notes (Signed)
NEW GYNECOLOGY PATIENT Patient name: Joanne Schneider MRN [id number]  Date of birth: Dec 16, 1990 Chief Complaint:   Fibroids     History:  Joanne Schneider is a 32 y.o. No obstetric history on file. being seen today for AUB-L.  Menses started getting heavier in mid2023 to now. Bleeding throughout hte month to the next month. Initially had some cramping but pain has susbsided. Megace helped and stopped the bleeding.   Has had a baby that has passed away, would want to have another one. Had a cesarean delivery previously.   Also reports trying to catch the blood clots and at one point had pushed and thought she felt something like an egg shape coming out but it was still attached to her so she stopped pushing. Has not had it again.  Reports pap completed 2/22 at [facility]  Ur[facility] then ED and has needed pRBC x2       Gynecologic History No LMP recorded. Contraception: none Last Pap: reports pap completed Feb 2024 Last Mammogram: n/a Last Colonoscopy: n/a  Obstetric History OB History  No obstetric history on file.    No past medical history on file.  Past Surgical History:  Procedure Laterality Date   CESAREAN SECTION      Current Outpatient Medications on File Prior to Visit  Medication Sig Dispense Refill   fluconazole (DIFLUCAN) 150 MG tablet Take 1 tablet (150 mg total) by mouth daily. (Patient not taking: Reported on 09/01/2022) 1 tablet 0   metroNIDAZOLE (FLAGYL) 500 MG tablet Take 1 tablet (500 mg total) by mouth 2 (two) times daily. (Patient not taking: Reported on 09/01/2022) 14 tablet 0   No current facility-administered medications on file prior to visit.    Allergies  Allergen Reactions   Shellfish Allergy    Peanut Butter Flavor Other (See Comments)   Percocet [Oxycodone-Acetaminophen] Hives and Itching    Social History:  reports that she has never smoked. She has never used smokeless tobacco. She reports current alcohol use. She reports  current drug use. Drug: Marijuana.  Family History  Problem Relation Age of Onset   Diabetes Father    Cancer Sister     The following portions of the patient's history were reviewed and updated as appropriate: allergies, current medications, past family history, past medical history, past social history, past surgical history and problem list.  Review of Systems Pertinent items noted in HPI and remainder of comprehensive ROS otherwise negative.  Physical Exam:  BP 120/75   Pulse 91   Wt 151 lb 1.6 oz (68.5 kg)   BMI 25.14 kg/m  Physical Exam Vitals and nursing note reviewed. Exam conducted with a chaperone present.  Constitutional:      Appearance: Normal appearance.  Cardiovascular:     Rate and Rhythm: Normal rate.  Pulmonary:     Effort: Pulmonary effort is normal.     Breath sounds: Normal breath sounds.  Genitourinary:    General: Normal vulva.     Exam position: Lithotomy position.     Comments: Normal appearing cervix with small volume blood, no lesions or protruding masses Neurological:     General: No focal deficit present.     Mental Status: She is alert and oriented to person, place, and time.  Psychiatric:        Mood and Affect: Mood normal.        Behavior: Behavior normal.        Thought Content: Thought content normal.  Judgment: Judgment normal.        Assessment and Plan:   1. Abnormal uterine bleeding (AUB) Discussed management options for abnormal uterine bleeding including NSAIDs (Naproxen), tranexamic acid (Lysteda), oral progesterone, Depo Provera, Levonogestrel IUD, endometrial ablation or hysterectomy as definitive surgical management.  Discussed risks and benefits of each method.    -Patient desires surgical management but is not sure and would prefer to continue with megace for now.  -Interested in having either the sonata procedure or myomectomy but not sure  - megestrol (MEGACE) 40 MG tablet; Take 1 tablet (40 mg total) by mouth  2 (two) times daily. Can increase to two tablets twice a day in the event of heavy bleeding  Dispense: 60 tablet; Refill: 5   Routine preventative health maintenance measures emphasized. Please refer to After Visit Summary for other counseling recommendations.   Follow-up: Return if symptoms worsen or fail to improve.      Lorriane Shire, MD Obstetrician & Gynecologist, Faculty Practice Minimally Invasive Gynecologic Surgery [facility] for [employer], [facility]

## 2022-09-03 ENCOUNTER — Ambulatory Visit: Payer: Self-pay

## 2022-09-03 ENCOUNTER — Inpatient Hospital Stay: Admission: RE | Admit: 2022-09-03 | Payer: Self-pay | Source: Ambulatory Visit

## 2022-09-09 ENCOUNTER — Other Ambulatory Visit: Payer: Self-pay

## 2023-02-14 IMAGING — DX DG FOOT COMPLETE 3+V*L*
3 series · 3 of 3 positions shown · non-contrast
Comparison: None.

CLINICAL DATA: Left foot injury, fifth digit pain

EXAM:
LEFT FOOT - COMPLETE 3+ VIEW

[foot ap]
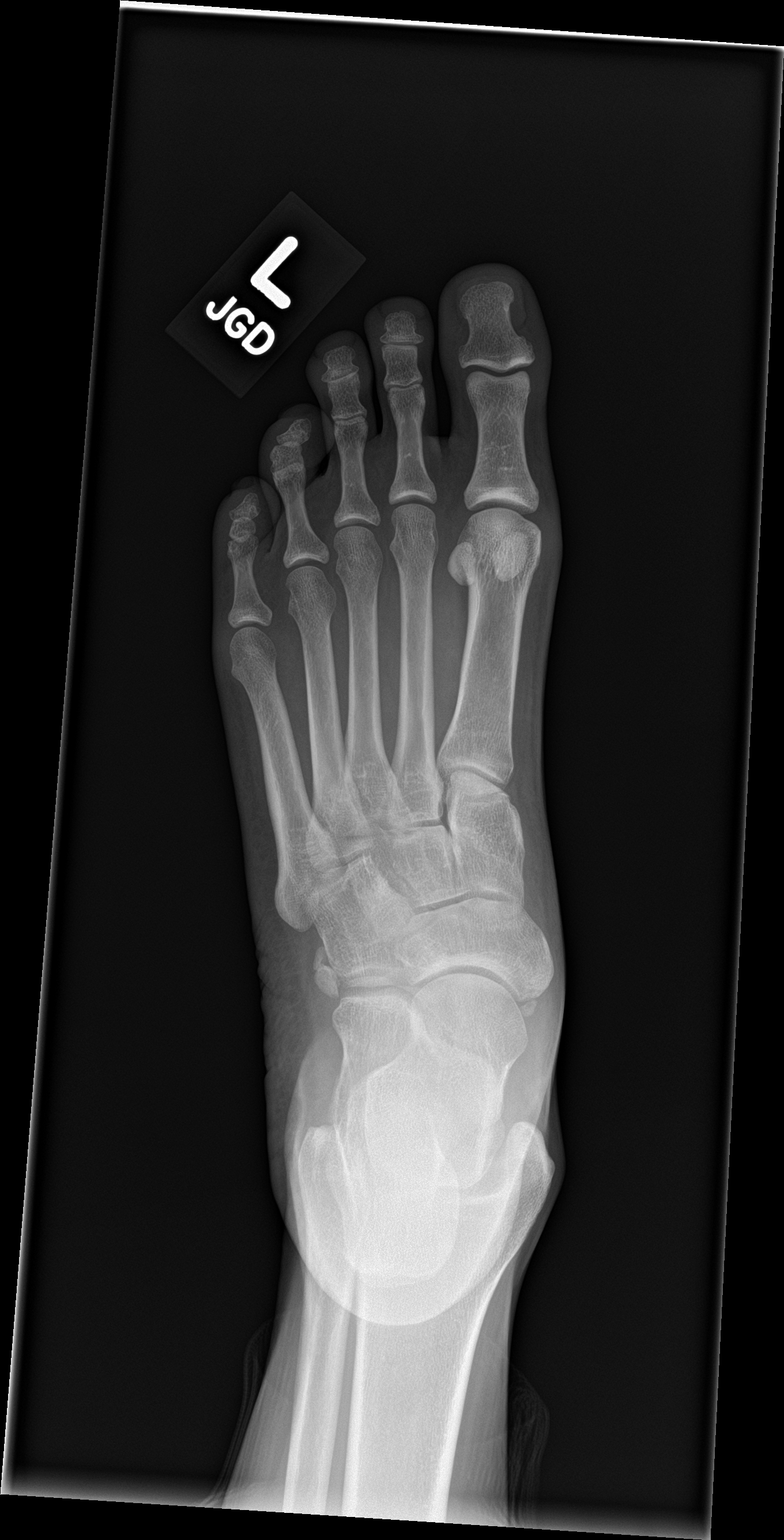

[foot obl]
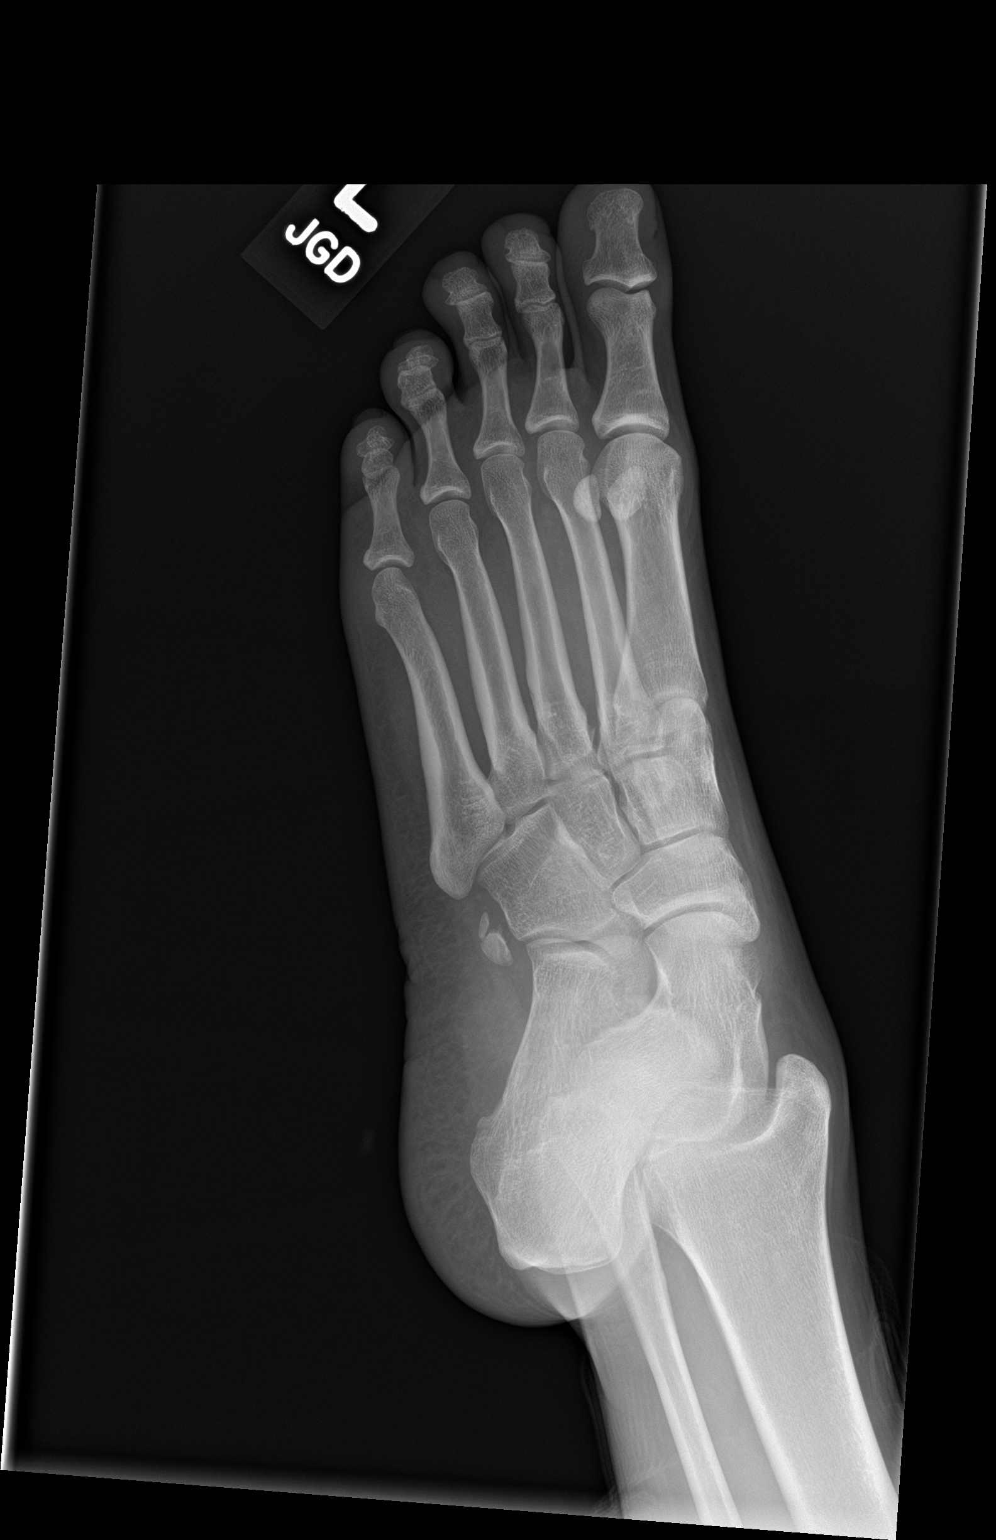

[foot lat]
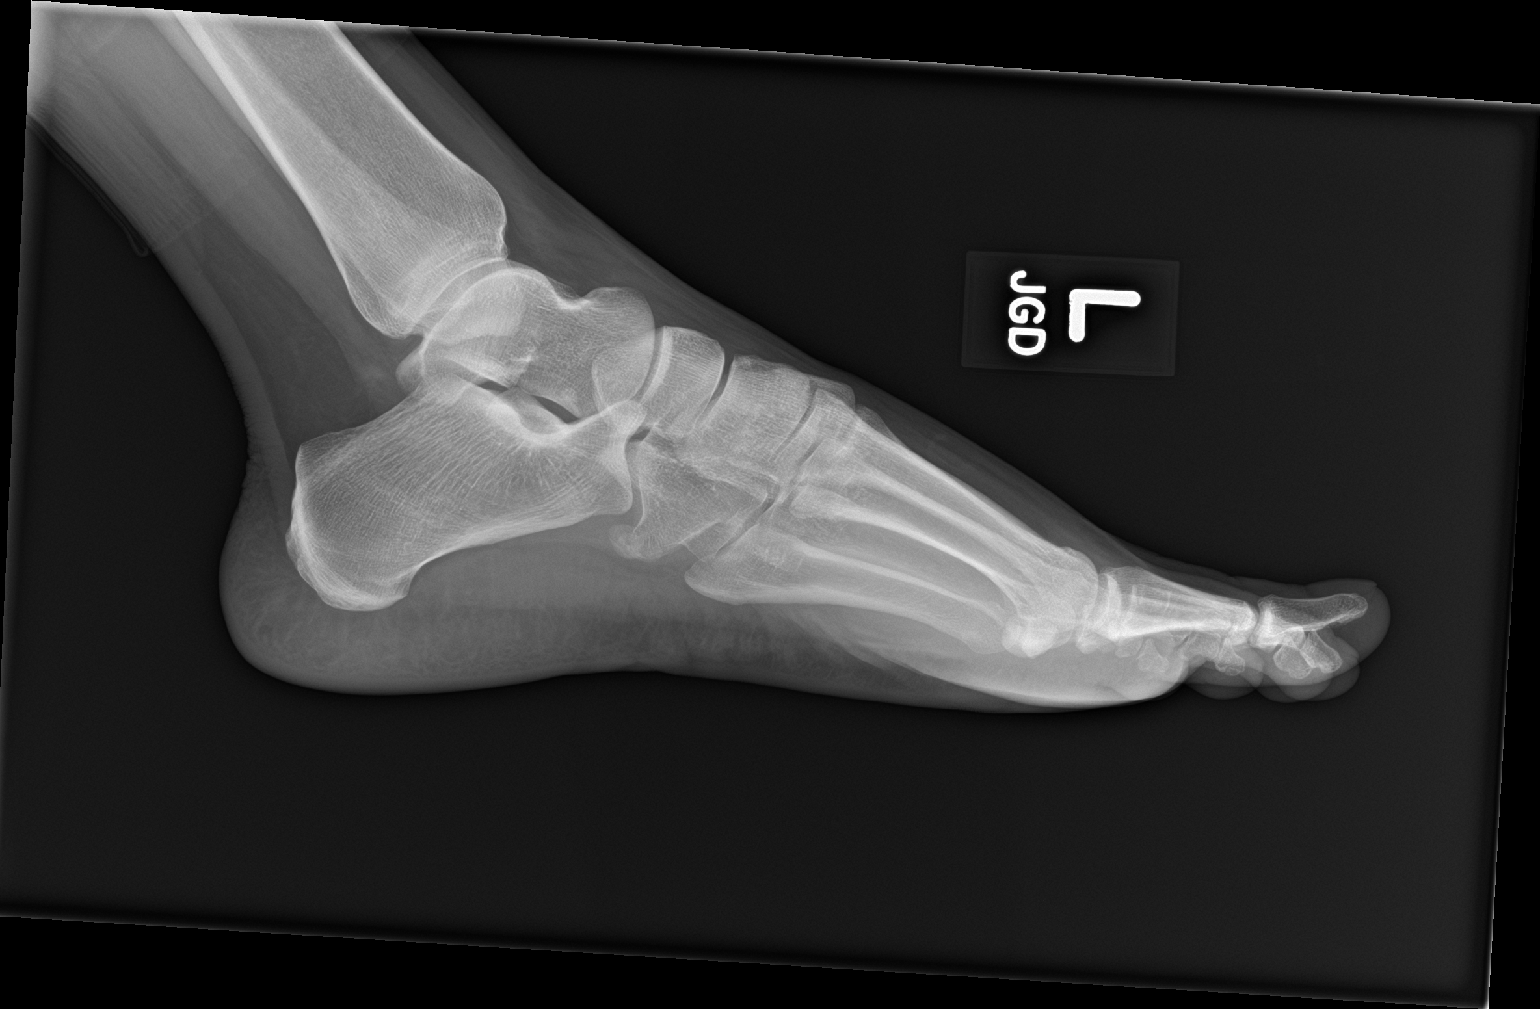

[3 of 3 positions shown; findings below may reference images not displayed]

FINDINGS: Frontal, oblique, and lateral views of the left foot are obtained.
No acute fracture, subluxation, or dislocation. Soft tissues are
unremarkable.
IMPRESSION: 1. No acute displaced fracture.

## 2023-02-25 ENCOUNTER — Ambulatory Visit (INDEPENDENT_AMBULATORY_CARE_PROVIDER_SITE_OTHER): Payer: Self-pay | Admitting: Obstetrics and Gynecology

## 2023-02-25 ENCOUNTER — Other Ambulatory Visit: Payer: Self-pay

## 2023-02-25 ENCOUNTER — Encounter: Payer: Self-pay | Admitting: Obstetrics and Gynecology

## 2023-02-25 VITALS — BP 131/86 | HR 104 | Wt 170.0 lb

## 2023-02-25 DIAGNOSIS — N939 Abnormal uterine and vaginal bleeding, unspecified: Secondary | ICD-10-CM

## 2023-02-25 DIAGNOSIS — D219 Benign neoplasm of connective and other soft tissue, unspecified: Secondary | ICD-10-CM

## 2023-02-25 MED ORDER — MEGESTROL ACETATE 40 MG PO TABS
40.0000 mg | ORAL_TABLET | Freq: Two times a day (BID) | ORAL | 5 refills | Status: DC
Start: 2023-02-25 — End: 2023-08-05

## 2023-02-25 NOTE — Progress Notes (Signed)
GYNECOLOGY VISIT  Patient name: Joanne Schneider MRN [id number]  Date of birth: 07/02/1990 Chief Complaint:   Fibroids  History:  Joanne Schneider is a 32 y.o. No obstetric history on file. being seen today for discussion of surgical management of fibroids. She previously did not schedule surgery due to international traveling and would like to proceedw with scheduling at this time  She is not sure if she qualifies for medicaid. She is currently in school on financial aid. Not currently insured.    Has gained 30lb since being on the the TID megace. Has noted that she has had increased hunger/appetite and eating more volume than she typically would. Has noticed improvement in bleeding with the megace.   Not allergic to just tylenol on its own as far as she is aware Not allergic to oxycodone by itself as far as she is aware  No past medical history on file.  Past Surgical History:  Procedure Laterality Date   CESAREAN SECTION      The following portions of the patient's history were reviewed and updated as appropriate: allergies, current medications, past family history, past medical history, past social history, past surgical history and problem list.   Health Maintenance:   Last pap ASCUS with HPV negative 07/2022 Last mammogram: n/a   Review of Systems:  Pertinent items are noted in HPI. Comprehensive review of systems was otherwise negative.   Objective:  Physical Exam BP 131/86   Pulse (!) 104   Wt 170 lb (77.1 kg)   LMP 01/29/2023 (Approximate)   BMI 28.29 kg/m    Physical Exam Vitals and nursing note reviewed.  Constitutional:      Appearance: Normal appearance.  HENT:     Head: Normocephalic and atraumatic.  Pulmonary:     Effort: Pulmonary effort is normal.  Abdominal:     Comments: Well healed low transverse incision  Skin:    General: Skin is warm and dry.  Neurological:     General: No focal deficit present.     Mental Status: She is alert.   Psychiatric:        Mood and Affect: Mood normal.        Behavior: Behavior normal.        Thought Content: Thought content normal.        Judgment: Judgment normal.        Assessment & Plan:   1. Abnormal uterine bleeding (AUB) Currently having weight gain with megace - will decrease to twice daily (has been taking TID). If still having increased appetite, will switch to aygestin. CBC today as well as A1c for preop testing. Give location of sonata, concern of thermal spread to bladder. Patient would prefer to proceed with myomectomy. Discussed minimally invasive approach and recovery. Discussed waiting 3-6 months to conceive.  -currently self pay; recommend reviewing insurance coverage options, financial aid application, and self pay (should receive estimate from scheduler) -APAP/oxy/NSAIDs for postop pain   - megestrol (MEGACE) 40 MG tablet; Take 1 tablet (40 mg total) by mouth 2 (two) times daily. Can increase to two tablets twice a day in the event of heavy bleeding  Dispense: 60 tablet; Refill: 5 - CBC - HgB A1c - Ambulatory Referral For Surgery Scheduling  2. Fibroid (Primary) LUS fibroid with submucosal component, note that removal of fibroid should demonstrate improvement in menses. Discussed restriction in when she can conceive, she does not intended to get pregnant immediately. Aware of risk of return of fibroids during her  reproductive lifetime.  - Ambulatory Referral For Surgery Scheduling  Patient desires surgical management with RA myomectomy.  The risks of surgery were discussed in detail with the patient including but not limited to: bleeding which may require transfusion or reoperation; infection which may require prolonged hospitalization or re-hospitalization and antibiotic therapy; injury to bowel, bladder, ureters and major vessels or other surrounding organs which may lead to other procedures; formation of adhesions; need for additional procedures including laparotomy  or subsequent procedures secondary to intraoperative injury or abnormal pathology; thromboembolic phenomenon; incisional problems and other postoperative or anesthesia complications.  Patient was told that the likelihood that her condition and symptoms will be treated effectively with this surgical management was moderate to high; the postoperative expectations were also discussed in detail. The patient also understands the alternative treatment options which were discussed in full. All questions were answered.  She was told that she will be contacted by our surgical scheduler regarding the time and date of her surgery; routine preoperative instructions will be given to her by the preoperative nursing team.    Printed patient education handouts about the procedure were given to the patient to review at home.   Routine preventative health maintenance measures emphasized.  Lorriane Shire, MD Minimally Invasive Gynecologic Surgery Center for [facility], [facility]

## 2023-02-26 ENCOUNTER — Encounter: Payer: Self-pay | Admitting: Obstetrics and Gynecology

## 2023-02-26 LAB — CBC
Hematocrit: 44.7 % (ref 34.0–46.6)
Hemoglobin: 13.7 g/dL (ref 11.1–15.9)
MCH: 24 pg — ABNORMAL LOW (ref 26.6–33.0)
MCHC: 30.6 g/dL — ABNORMAL LOW (ref 31.5–35.7)
MCV: 78 fL — ABNORMAL LOW (ref 79–97)
Platelets: 490 10*3/uL — ABNORMAL HIGH (ref 150–450)
RBC: 5.71 x10E6/uL — ABNORMAL HIGH (ref 3.77–5.28)
RDW: 17.6 % — ABNORMAL HIGH (ref 11.7–15.4)
WBC: 7.1 10*3/uL (ref 3.4–10.8)

## 2023-02-26 LAB — HEMOGLOBIN A1C
Est. average glucose Bld gHb Est-mCnc: 137 mg/dL
Hgb A1c MFr Bld: 6.4 % — ABNORMAL HIGH (ref 4.8–5.6)

## 2023-03-02 ENCOUNTER — Encounter: Payer: Self-pay | Admitting: Obstetrics and Gynecology

## 2023-04-01 ENCOUNTER — Telehealth: Payer: Self-pay

## 2023-04-01 NOTE — Telephone Encounter (Signed)
Called to schedule surgery w/ Dr. Briscoe Deutscher. Patient is currently uninsured and does not have the funds to pay the self pay PB balance prior to surgery. Patient states she was planning to drop off the FA application today. I asked patient to call me once her application was accepted and pending.

## 2023-06-26 DIAGNOSIS — Z419 Encounter for procedure for purposes other than remedying health state, unspecified: Secondary | ICD-10-CM | POA: Diagnosis not present

## 2023-07-01 ENCOUNTER — Telehealth: Payer: Self-pay

## 2023-07-01 NOTE — Telephone Encounter (Signed)
 Patient called to notify me of her recent Medicaid approval. I advised Dr. Elester Grim was fully booked for robot cases in June but I would get her scheduled once the July schedule is available. Patient states she has a planned trip to [place] on 7/16 and prefers to be scheduled in Aug. I agreed to call patient when the schedule becomes available.

## 2023-07-07 DIAGNOSIS — Z803 Family history of malignant neoplasm of breast: Secondary | ICD-10-CM | POA: Diagnosis not present

## 2023-07-07 DIAGNOSIS — Z01419 Encounter for gynecological examination (general) (routine) without abnormal findings: Secondary | ICD-10-CM | POA: Diagnosis not present

## 2023-07-07 DIAGNOSIS — N760 Acute vaginitis: Secondary | ICD-10-CM | POA: Diagnosis not present

## 2023-07-07 DIAGNOSIS — Z113 Encounter for screening for infections with a predominantly sexual mode of transmission: Secondary | ICD-10-CM | POA: Diagnosis not present

## 2023-07-07 DIAGNOSIS — Z3049 Encounter for surveillance of other contraceptives: Secondary | ICD-10-CM | POA: Diagnosis not present

## 2023-07-07 DIAGNOSIS — Z114 Encounter for screening for human immunodeficiency virus [HIV]: Secondary | ICD-10-CM | POA: Diagnosis not present

## 2023-07-07 DIAGNOSIS — B3731 Acute candidiasis of vulva and vagina: Secondary | ICD-10-CM | POA: Diagnosis not present

## 2023-07-26 DIAGNOSIS — Z419 Encounter for procedure for purposes other than remedying health state, unspecified: Secondary | ICD-10-CM | POA: Diagnosis not present

## 2023-07-30 ENCOUNTER — Telehealth: Payer: Self-pay

## 2023-07-30 NOTE — Telephone Encounter (Signed)
 I called patient to see if she wanted to schedule surgery with Dr. Elester Grim in July. Patient is scheduled for a trip in July and will take the first available in August.

## 2023-08-05 ENCOUNTER — Other Ambulatory Visit: Payer: Self-pay | Admitting: Lactation Services

## 2023-08-05 DIAGNOSIS — N939 Abnormal uterine and vaginal bleeding, unspecified: Secondary | ICD-10-CM

## 2023-08-10 MED ORDER — MEGESTROL ACETATE 40 MG PO TABS
40.0000 mg | ORAL_TABLET | Freq: Two times a day (BID) | ORAL | 5 refills | Status: DC
Start: 2023-08-10 — End: 2023-10-19

## 2023-08-17 ENCOUNTER — Telehealth: Payer: Self-pay

## 2023-08-17 NOTE — Telephone Encounter (Signed)
 I called patient to advise that Dr. Elester Grim August OR schedule is available. Patient asked to be scheduled on 10/19/23 at 7:30 am at [facility]. Pre-op instructions and surgery details were provided by phone.

## 2023-08-26 DIAGNOSIS — Z419 Encounter for procedure for purposes other than remedying health state, unspecified: Secondary | ICD-10-CM | POA: Diagnosis not present

## 2023-09-25 DIAGNOSIS — Z419 Encounter for procedure for purposes other than remedying health state, unspecified: Secondary | ICD-10-CM | POA: Diagnosis not present

## 2023-10-01 ENCOUNTER — Other Ambulatory Visit (HOSPITAL_COMMUNITY): Payer: Self-pay

## 2023-10-01 ENCOUNTER — Encounter: Payer: Self-pay | Admitting: Obstetrics and Gynecology

## 2023-10-01 ENCOUNTER — Other Ambulatory Visit: Payer: Self-pay

## 2023-10-01 DIAGNOSIS — N939 Abnormal uterine and vaginal bleeding, unspecified: Secondary | ICD-10-CM

## 2023-10-01 MED ORDER — MEGESTROL ACETATE 20 MG PO TABS
40.0000 mg | ORAL_TABLET | Freq: Two times a day (BID) | ORAL | 5 refills | Status: DC
Start: 2023-10-01 — End: 2023-11-24
  Filled 2023-10-01: qty 120, 30d supply, fill #0

## 2023-10-11 ENCOUNTER — Encounter (HOSPITAL_COMMUNITY): Payer: Self-pay | Admitting: Obstetrics and Gynecology

## 2023-10-13 ENCOUNTER — Encounter (HOSPITAL_COMMUNITY): Payer: Self-pay | Admitting: Obstetrics and Gynecology

## 2023-10-13 NOTE — Pre-Procedure Instructions (Addendum)
 Surgical Instructions   Your procedure is scheduled on :  Wednesday,  10-20-2023. Report to [facility] Entrance A at 5:30  A.M., then check in with the Admitting office. Any questions or running late day of surgery: call [phone]  Questions prior to your surgery date: call [phone], Monday-Friday, 8am-4pm. If you experience any cold or flu symptoms such as cough, fever, chills, shortness of breath, etc. between now and your scheduled surgery, please notify your surgeon office.    Remember:  Do not eat any food after midnight the night before your surgery.  You may have clear liquids from midnight night before surgery until 4:30 AM.   Clear liquids allowed are:  Water Carbonated Beverages Clear Tea (no milk, honey, etc.) Black Coffee Only (NO MILK, CREAM OR POWDERED CREAMER of any kind) Sport drinks , like Gatorade  NO clear liquids after 4:30 AM day of surgery.  This includes no water,  candy,  gum,  and  mints.    Take these medicines the morning of surgery with A SIPS OF WATER : Megestrol  (megace ) Fluticasone propionate inhaler   May take these medicines IF NEEDED: Albuterol (ventolin) inhaler ~Please bring this inhaler with you day of surgery   One week prior to surgery, STOP taking any Aspirin (unless otherwise instructed by your surgeon) Aleve , Naproxen , Ibuprofen, Motrin, Advil, Goody's, BC's, all herbal medications, fish oil, and non-prescription vitamins.                     Do NOT Smoke (Tobacco/Vaping) and Do Not drink alcohol for 24 hours prior to your procedure.  If you use a CPAP at night, you may bring your mask/headgear for your overnight stay.   You will be asked to remove any contacts, glasses, piercing's, hearing aid's, dentures/partials prior to surgery. Please bring cases for these items if needed.    Patients discharged the day of surgery will not be allowed to drive home, and someone needs to stay with them for 24 hours.  SURGICAL  WAITING ROOM VISITATION Patients may have no more than 2 support people in the waiting area - these visitors may rotate.   Pre-op nurse will coordinate an appropriate time for 1 ADULT support person, who may not rotate, to accompany patient in pre-op.  Children under the age of 36 must have an adult with them who is not the patient and must remain in the main waiting area with an adult.  If the patient needs to stay at the hospital during part of their recovery, the visitor guidelines for inpatient rooms apply.  Please refer to the [facility] website for the visitor guidelines for any additional information.   If you received a COVID test during your pre-op visit  it is requested that you wear a mask when out in public, stay away from anyone that may not be feeling well and notify your surgeon if you develop symptoms. If you have been in contact with anyone that has tested positive in the last 10 days please notify you surgeon.      Pre-operative CHG Bathing Instructions   You can play a key role in reducing the risk of infection after surgery. Your skin needs to be as free of germs as possible. You can reduce the number of germs on your skin by washing with CHG (chlorhexidine gluconate) soap before surgery. CHG is an antiseptic soap that kills germs and continues to kill germs even after washing.   DO NOT use  if you have an allergy to chlorhexidine/CHG or antibacterial soaps. If your skin becomes reddened or irritated, stop using the CHG and notify one of our RNs day of surgery.             TAKE A SHOWER THE NIGHT BEFORE SURGERY AND THE DAY OF SURGERY    Please keep in mind the following:  DO NOT shave, including legs and underarms, 48 hours prior to surgery.   You may shave your face before/day of surgery.  Place clean sheets on your bed the night before surgery Use a clean washcloth (not used since being washed) for each shower. DO NOT sleep with pet's night before surgery.  CHG  Shower Instructions:  Wash your face and private area with normal soap. If you choose to wash your hair, wash first with your normal shampoo.  After you use shampoo/soap, rinse your hair and body thoroughly to remove shampoo/soap residue.  Turn the water OFF and apply half the bottle of CHG soap to a CLEAN washcloth.  Apply CHG soap ONLY FROM YOUR NECK DOWN TO YOUR TOES (washing for 3-5 minutes)  DO NOT use CHG soap on face, private areas, open wounds, or sores.  Pay special attention to the area where your surgery is being performed.  If you are having back surgery, having someone wash your back for you may be helpful. Wait 2 minutes after CHG soap is applied, then you may rinse off the CHG soap.  Pat dry with a clean towel  Put on clean pajamas    Additional instructions for the day of surgery: DO NOT APPLY any lotions,  powder,  oils,  deodorants (may use underarm deodorant) , cologne/ perfumes  or makeup Do not wear jewelry/  piercing's/  metal/  permanent jewelry must be removed prior to arrival day of surgery.  (No plastic piercing) Do not wear nail polish, gel polish, artificial nails, or any other type of covering on natural finger nails (toe nails are okay) Do not bring valuables to the hospital. [facility] is not responsible for valuables/personal belongings. Put on clean/comfortable clothes.  Please brush your teeth.  Ask your nurse before applying any prescription medications to the skin.

## 2023-10-13 NOTE — Progress Notes (Signed)
 Spoke w/ via phone for pre-op interview--- pt Lab needs dos----  upt   (per anes)    Lab results------ lab appt 10-15-2023 @ 1300 getting CBC (per anes)/ T&S COVID test -----patient states asymptomatic no test needed Arrive at ------- 0530 on 10-20-2023 NPO after MN NO Solid Food.  Clear liquids from MN until--- 0430 Pre-Surgery Ensure or G2: n/a  Med rec completed Medications to take morning of surgery ----- megace , fluticasone propionate inhaler Diabetic medication ----- n/a  GLP1 agonist last dose: n/a GLP1 instructions:  Patient instructed no nail polish to be worn day of surgery Patient instructed to bring photo id and insurance card day of surgery Patient aware to have Driver (ride ) / caregiver    for 24 hours after surgery - sig other, Joanne Schneider Patient Special Instructions ----- will pick up soap and written instructions at lab appt Pre-Op special Instructions ----- n/a  Patient verbalized understanding of instructions that were given at this phone interview. Patient denies chest pain, sob, fever, cough at the interview.

## 2023-10-15 ENCOUNTER — Encounter (HOSPITAL_COMMUNITY)
Admission: RE | Admit: 2023-10-15 | Discharge: 2023-10-15 | Disposition: A | Discharge status: Home / Self Care | Source: Ambulatory Visit | Attending: Obstetrics and Gynecology | Admitting: Obstetrics and Gynecology

## 2023-10-15 DIAGNOSIS — Z01818 Encounter for other preprocedural examination: Secondary | ICD-10-CM

## 2023-10-15 DIAGNOSIS — Z01812 Encounter for preprocedural laboratory examination: Secondary | ICD-10-CM | POA: Insufficient documentation

## 2023-10-15 DIAGNOSIS — N939 Abnormal uterine and vaginal bleeding, unspecified: Secondary | ICD-10-CM | POA: Insufficient documentation

## 2023-10-15 LAB — CBC
HCT: 45.6 % (ref 36.0–46.0)
Hemoglobin: 14.5 g/dL (ref 12.0–15.0)
MCH: 27.2 pg (ref 26.0–34.0)
MCHC: 31.8 g/dL (ref 30.0–36.0)
MCV: 85.4 fL (ref 80.0–100.0)
Platelets: 340 K/uL (ref 150–400)
RBC: 5.34 MIL/uL — ABNORMAL HIGH (ref 3.87–5.11)
RDW: 14.3 % (ref 11.5–15.5)
WBC: 7.4 K/uL (ref 4.0–10.5)
nRBC: 0 % (ref 0.0–0.2)

## 2023-10-19 ENCOUNTER — Encounter (HOSPITAL_COMMUNITY): Payer: Self-pay | Admitting: Obstetrics and Gynecology

## 2023-10-19 ENCOUNTER — Encounter (HOSPITAL_COMMUNITY)
Admission: RE | Disposition: A | Discharge status: Home / Self Care | Payer: Self-pay | Source: Home / Self Care | Attending: Obstetrics and Gynecology

## 2023-10-19 ENCOUNTER — Ambulatory Visit (HOSPITAL_COMMUNITY)
Admission: RE | Admit: 2023-10-19 | Discharge: 2023-10-19 | Disposition: A | Discharge status: Home / Self Care | Attending: Obstetrics and Gynecology | Admitting: Obstetrics and Gynecology

## 2023-10-19 ENCOUNTER — Other Ambulatory Visit: Payer: Self-pay

## 2023-10-19 ENCOUNTER — Encounter: Payer: Self-pay | Admitting: Obstetrics and Gynecology

## 2023-10-19 ENCOUNTER — Ambulatory Visit (HOSPITAL_COMMUNITY): Payer: Self-pay | Admitting: Anesthesiology

## 2023-10-19 ENCOUNTER — Other Ambulatory Visit (HOSPITAL_COMMUNITY): Payer: Self-pay

## 2023-10-19 ENCOUNTER — Ambulatory Visit (HOSPITAL_BASED_OUTPATIENT_CLINIC_OR_DEPARTMENT_OTHER): Payer: Self-pay | Admitting: Anesthesiology

## 2023-10-19 DIAGNOSIS — J4530 Mild persistent asthma, uncomplicated: Secondary | ICD-10-CM | POA: Diagnosis not present

## 2023-10-19 DIAGNOSIS — D219 Benign neoplasm of connective and other soft tissue, unspecified: Secondary | ICD-10-CM | POA: Diagnosis present

## 2023-10-19 DIAGNOSIS — Z01818 Encounter for other preprocedural examination: Secondary | ICD-10-CM

## 2023-10-19 DIAGNOSIS — D250 Submucous leiomyoma of uterus: Secondary | ICD-10-CM | POA: Diagnosis present

## 2023-10-19 DIAGNOSIS — N939 Abnormal uterine and vaginal bleeding, unspecified: Secondary | ICD-10-CM | POA: Insufficient documentation

## 2023-10-19 DIAGNOSIS — D259 Leiomyoma of uterus, unspecified: Secondary | ICD-10-CM

## 2023-10-19 DIAGNOSIS — R7303 Prediabetes: Secondary | ICD-10-CM | POA: Insufficient documentation

## 2023-10-19 DIAGNOSIS — N938 Other specified abnormal uterine and vaginal bleeding: Secondary | ICD-10-CM | POA: Diagnosis present

## 2023-10-19 DIAGNOSIS — D500 Iron deficiency anemia secondary to blood loss (chronic): Secondary | ICD-10-CM | POA: Diagnosis not present

## 2023-10-19 HISTORY — DX: Mild persistent asthma, uncomplicated: J45.30

## 2023-10-19 HISTORY — PX: MYOMECTOMY, LAPAROSCOPIC: SHX7610

## 2023-10-19 HISTORY — PX: ROBOTIC ASSISTED LAPAROSCOPIC LYSIS OF ADHESION: SHX6080

## 2023-10-19 HISTORY — DX: Presence of spectacles and contact lenses: Z97.3

## 2023-10-19 HISTORY — DX: Dyspnea, unspecified: R06.00

## 2023-10-19 HISTORY — DX: Prediabetes: R73.03

## 2023-10-19 HISTORY — DX: Abnormal uterine and vaginal bleeding, unspecified: N93.9

## 2023-10-19 HISTORY — DX: Iron deficiency anemia secondary to blood loss (chronic): D50.0

## 2023-10-19 HISTORY — DX: Leiomyoma of uterus, unspecified: D25.9

## 2023-10-19 LAB — POCT PREGNANCY, URINE: Preg Test, Ur: NEGATIVE

## 2023-10-19 SURGERY — LYSIS, ADHESIONS, ROBOT-ASSISTED, LAPAROSCOPIC
Anesthesia: General

## 2023-10-19 MED ORDER — HYDROMORPHONE HCL 1 MG/ML IJ SOLN
INTRAMUSCULAR | Status: AC
  Filled 2023-10-19: qty 0.5

## 2023-10-19 MED ORDER — VASOPRESSIN 20 UNIT/ML IV SOLN
INTRAVENOUS | Status: DC | PRN
  Administered 2023-10-19: 24 mL via SURGICAL_CAVITY

## 2023-10-19 MED ORDER — CHLORHEXIDINE GLUCONATE 0.12 % MT SOLN
OROMUCOSAL | Status: AC
  Administered 2023-10-19: 15 mL via OROMUCOSAL
  Filled 2023-10-19: qty 15

## 2023-10-19 MED ORDER — SODIUM CHLORIDE (PF) 0.9 % IJ SOLN
INTRAMUSCULAR | Status: AC
Start: 2023-10-19 — End: 2023-10-19
  Filled 2023-10-19: qty 50

## 2023-10-19 MED ORDER — SODIUM CHLORIDE 0.9 % IR SOLN
Status: DC | PRN
  Administered 2023-10-19: 1000 mL

## 2023-10-19 MED ORDER — BUPIVACAINE LIPOSOME 1.3 % IJ SUSP
INTRAMUSCULAR | Status: AC
  Filled 2023-10-19: qty 20

## 2023-10-19 MED ORDER — PHENYLEPHRINE 80 MCG/ML (10ML) SYRINGE FOR IV PUSH (FOR BLOOD PRESSURE SUPPORT)
PREFILLED_SYRINGE | INTRAVENOUS | Status: AC
  Filled 2023-10-19: qty 10

## 2023-10-19 MED ORDER — CEFAZOLIN SODIUM 1 G IJ SOLR
INTRAMUSCULAR | Status: AC
  Filled 2023-10-19: qty 20

## 2023-10-19 MED ORDER — PROPOFOL 10 MG/ML IV BOLUS
INTRAVENOUS | Status: AC
  Filled 2023-10-19: qty 20

## 2023-10-19 MED ORDER — DEXAMETHASONE SODIUM PHOSPHATE 10 MG/ML IJ SOLN
INTRAMUSCULAR | Status: AC
  Filled 2023-10-19: qty 1

## 2023-10-19 MED ORDER — MIDAZOLAM HCL 2 MG/2ML IJ SOLN
INTRAMUSCULAR | Status: AC
  Filled 2023-10-19: qty 2

## 2023-10-19 MED ORDER — IBUPROFEN 600 MG PO TABS
600.0000 mg | ORAL_TABLET | Freq: Four times a day (QID) | ORAL | 1 refills | Status: AC | PRN
  Filled 2023-10-19: qty 120, 30d supply, fill #0

## 2023-10-19 MED ORDER — ACETAMINOPHEN 500 MG PO TABS
ORAL_TABLET | ORAL | Status: AC
Start: 2023-10-19 — End: 2023-10-19
  Administered 2023-10-19: 1000 mg via ORAL
  Filled 2023-10-19: qty 2

## 2023-10-19 MED ORDER — DEXMEDETOMIDINE HCL IN NACL 80 MCG/20ML IV SOLN
INTRAVENOUS | Status: DC | PRN
Start: 2023-10-19 — End: 2023-10-19
  Administered 2023-10-19: 10 ug via INTRAVENOUS

## 2023-10-19 MED ORDER — METRONIDAZOLE 500 MG/100ML IV SOLN
INTRAVENOUS | Status: DC | PRN
  Administered 2023-10-19: 500 mg via INTRAVENOUS

## 2023-10-19 MED ORDER — DEXAMETHASONE SODIUM PHOSPHATE 10 MG/ML IJ SOLN
INTRAMUSCULAR | Status: DC | PRN
  Administered 2023-10-19: 10 mg via INTRAVENOUS

## 2023-10-19 MED ORDER — ORAL CARE MOUTH RINSE: 15.0000 mL | Freq: Once | OROMUCOSAL | Status: AC

## 2023-10-19 MED ORDER — MIDAZOLAM HCL 2 MG/2ML IJ SOLN
INTRAMUSCULAR | Status: DC | PRN
  Administered 2023-10-19: 2 mg via INTRAVENOUS

## 2023-10-19 MED ORDER — ACETAMINOPHEN 500 MG PO TABS: 1000.0000 mg | ORAL_TABLET | Freq: Once | ORAL | Status: AC

## 2023-10-19 MED ORDER — SODIUM CHLORIDE (PF) 0.9 % IJ SOLN
INTRAMUSCULAR | Status: AC
  Filled 2023-10-19: qty 50

## 2023-10-19 MED ORDER — ESMOLOL HCL 100 MG/10ML IV SOLN
INTRAVENOUS | Status: DC | PRN
Start: 2023-10-19 — End: 2023-10-19
  Administered 2023-10-19: 30 mg via INTRAVENOUS

## 2023-10-19 MED ORDER — VASOPRESSIN 20 UNIT/ML IV SOLN
INTRAVENOUS | Status: AC
Start: 2023-10-19 — End: 2023-10-19
  Filled 2023-10-19: qty 1

## 2023-10-19 MED ORDER — BUPIVACAINE LIPOSOME 1.3 % IJ SUSP
INTRAMUSCULAR | Status: DC | PRN
  Administered 2023-10-19: 40 mL via SURGICAL_CAVITY

## 2023-10-19 MED ORDER — ONDANSETRON HCL 4 MG/2ML IJ SOLN
INTRAMUSCULAR | Status: DC | PRN
Start: 2023-10-19 — End: 2023-10-19
  Administered 2023-10-19: 4 mg via INTRAVENOUS

## 2023-10-19 MED ORDER — GABAPENTIN 300 MG PO CAPS: 300.0000 mg | ORAL_CAPSULE | ORAL | Status: AC

## 2023-10-19 MED ORDER — FENTANYL CITRATE (PF) 100 MCG/2ML IJ SOLN: 25.0000 ug | INTRAMUSCULAR | Status: DC | PRN

## 2023-10-19 MED ORDER — LIDOCAINE 2% (20 MG/ML) 5 ML SYRINGE
INTRAMUSCULAR | Status: AC
  Filled 2023-10-19: qty 5

## 2023-10-19 MED ORDER — BUPIVACAINE HCL (PF) 0.5 % IJ SOLN
INTRAMUSCULAR | Status: AC
  Filled 2023-10-19: qty 30

## 2023-10-19 MED ORDER — DEXMEDETOMIDINE HCL IN NACL 80 MCG/20ML IV SOLN
INTRAVENOUS | Status: AC
  Filled 2023-10-19: qty 20

## 2023-10-19 MED ORDER — KETOROLAC TROMETHAMINE 30 MG/ML IJ SOLN
INTRAMUSCULAR | Status: DC | PRN
Start: 2023-10-19 — End: 2023-10-19
  Administered 2023-10-19: 30 mg via INTRAVENOUS

## 2023-10-19 MED ORDER — AMISULPRIDE (ANTIEMETIC) 5 MG/2ML IV SOLN: 10.0000 mg | Freq: Once | INTRAVENOUS | Status: DC | PRN

## 2023-10-19 MED ORDER — HYDROMORPHONE HCL 1 MG/ML IJ SOLN
INTRAMUSCULAR | Status: DC | PRN
  Administered 2023-10-19 (×2): .5 mg via INTRAVENOUS

## 2023-10-19 MED ORDER — ACETAMINOPHEN 500 MG PO TABS: 1000.0000 mg | ORAL_TABLET | Freq: Four times a day (QID) | ORAL | 1 refills | Status: DC | PRN

## 2023-10-19 MED ORDER — ROCURONIUM BROMIDE 10 MG/ML (PF) SYRINGE
PREFILLED_SYRINGE | INTRAVENOUS | Status: DC | PRN
Start: 2023-10-19 — End: 2023-10-19
  Administered 2023-10-19 (×2): 10 mg via INTRAVENOUS
  Administered 2023-10-19: 50 mg via INTRAVENOUS
  Administered 2023-10-19: 10 mg via INTRAVENOUS

## 2023-10-19 MED ORDER — OXYCODONE HCL 5 MG PO TABS
ORAL_TABLET | ORAL | Status: AC
  Filled 2023-10-19: qty 1

## 2023-10-19 MED ORDER — ESMOLOL HCL 100 MG/10ML IV SOLN
INTRAVENOUS | Status: AC
Start: 2023-10-19 — End: 2023-10-19
  Filled 2023-10-19: qty 10

## 2023-10-19 MED ORDER — CEFAZOLIN SODIUM-DEXTROSE 2-3 GM-%(50ML) IV SOLR
INTRAVENOUS | Status: DC | PRN
  Administered 2023-10-19: 2 g via INTRAVENOUS

## 2023-10-19 MED ORDER — POLYETHYLENE GLYCOL 3350 17 GM/SCOOP PO POWD
17.0000 g | Freq: Every day | ORAL | 1 refills | Status: DC
  Filled 2023-10-19: qty 476, 28d supply, fill #0

## 2023-10-19 MED ORDER — SODIUM CHLORIDE (PF) 0.9 % IJ SOLN
INTRAMUSCULAR | Status: AC
  Filled 2023-10-19: qty 10

## 2023-10-19 MED ORDER — PHENYLEPHRINE 80 MCG/ML (10ML) SYRINGE FOR IV PUSH (FOR BLOOD PRESSURE SUPPORT)
PREFILLED_SYRINGE | INTRAVENOUS | Status: DC | PRN
Start: 2023-10-19 — End: 2023-10-19
  Administered 2023-10-19: 80 ug via INTRAVENOUS

## 2023-10-19 MED ORDER — KETOROLAC TROMETHAMINE 30 MG/ML IJ SOLN
INTRAMUSCULAR | Status: AC
  Filled 2023-10-19: qty 1

## 2023-10-19 MED ORDER — FLUORESCEIN SODIUM 10 % IV SOLN
INTRAVENOUS | Status: AC
  Filled 2023-10-19: qty 5

## 2023-10-19 MED ORDER — 0.9 % SODIUM CHLORIDE (POUR BTL) OPTIME
TOPICAL | Status: DC | PRN
  Administered 2023-10-19: 1000 mL

## 2023-10-19 MED ORDER — FENTANYL CITRATE (PF) 250 MCG/5ML IJ SOLN
INTRAMUSCULAR | Status: DC | PRN
  Administered 2023-10-19: 50 ug via INTRAVENOUS
  Administered 2023-10-19: 100 ug via INTRAVENOUS
  Administered 2023-10-19 (×2): 50 ug via INTRAVENOUS

## 2023-10-19 MED ORDER — LACTATED RINGERS IV SOLN: INTRAVENOUS | Status: DC

## 2023-10-19 MED ORDER — SUGAMMADEX SODIUM 200 MG/2ML IV SOLN
INTRAVENOUS | Status: DC | PRN
Start: 2023-10-19 — End: 2023-10-19
  Administered 2023-10-19: 200 mg via INTRAVENOUS

## 2023-10-19 MED ORDER — CHLORHEXIDINE GLUCONATE 0.12 % MT SOLN: 15.0000 mL | Freq: Once | OROMUCOSAL | Status: AC

## 2023-10-19 MED ORDER — PROPOFOL 10 MG/ML IV BOLUS
INTRAVENOUS | Status: DC | PRN
Start: 2023-10-19 — End: 2023-10-19
  Administered 2023-10-19: 130 mg via INTRAVENOUS

## 2023-10-19 MED ORDER — LIDOCAINE 2% (20 MG/ML) 5 ML SYRINGE
INTRAMUSCULAR | Status: DC | PRN
  Administered 2023-10-19: 100 mg via INTRAVENOUS

## 2023-10-19 MED ORDER — GABAPENTIN 300 MG PO CAPS
ORAL_CAPSULE | ORAL | Status: AC
Start: 2023-10-19 — End: 2023-10-19
  Administered 2023-10-19: 300 mg via ORAL
  Filled 2023-10-19: qty 1

## 2023-10-19 MED ORDER — FENTANYL CITRATE (PF) 250 MCG/5ML IJ SOLN
INTRAMUSCULAR | Status: AC
  Filled 2023-10-19: qty 5

## 2023-10-19 MED ORDER — POVIDONE-IODINE 10 % EX SWAB
2.0000 | Freq: Once | CUTANEOUS | Status: AC
  Administered 2023-10-19: 2 via TOPICAL

## 2023-10-19 MED ORDER — OXYCODONE HCL 5 MG PO TABS
5.0000 mg | ORAL_TABLET | ORAL | Status: DC | PRN
  Administered 2023-10-19: 5 mg via ORAL

## 2023-10-19 MED ORDER — ONDANSETRON HCL 4 MG/2ML IJ SOLN
INTRAMUSCULAR | Status: AC
  Filled 2023-10-19: qty 2

## 2023-10-19 MED ORDER — ROCURONIUM BROMIDE 10 MG/ML (PF) SYRINGE
PREFILLED_SYRINGE | INTRAVENOUS | Status: AC
  Filled 2023-10-19: qty 10

## 2023-10-19 MED ORDER — OXYCODONE HCL 5 MG PO TABS
5.0000 mg | ORAL_TABLET | ORAL | 0 refills | Status: DC | PRN
  Filled 2023-10-19: qty 20, 4d supply, fill #0

## 2023-10-19 SURGICAL SUPPLY — 64 items
APPLICATOR ARISTA FLEXITIP XL (MISCELLANEOUS) IMPLANT
BARRIER ADHS 3X4 INTERCEED (GAUZE/BANDAGES/DRESSINGS) IMPLANT
CHLORAPREP W/TINT 26 (MISCELLANEOUS) ×1 IMPLANT
COVER BACK TABLE 60X90IN (DRAPES) ×1 IMPLANT
COVER TIP SHEARS 8 DVNC (MISCELLANEOUS) ×1 IMPLANT
DEFOGGER SCOPE WARM SEASHARP (MISCELLANEOUS) ×1 IMPLANT
DERMABOND ADVANCED .7 DNX12 (GAUZE/BANDAGES/DRESSINGS) ×1 IMPLANT
DRAPE ARM DVNC X/XI (DISPOSABLE) ×4 IMPLANT
DRAPE COLUMN DVNC XI (DISPOSABLE) ×1 IMPLANT
DRAPE SURG IRRIG POUCH 19X23 (DRAPES) ×1 IMPLANT
DRAPE UTILITY XL STRL (DRAPES) ×1 IMPLANT
DRIVER NDL MEGA SUTCUT DVNCXI (INSTRUMENTS) ×1 IMPLANT
DRIVER NDLE MEGA SUTCUT DVNCXI (INSTRUMENTS) ×1 IMPLANT
ELECTRODE REM PT RTRN 9FT ADLT (ELECTROSURGICAL) ×1 IMPLANT
FORCEPS BPLR 8 MD DVNC XI (FORCEP) IMPLANT
FORCEPS BPLR FENES DVNC XI (FORCEP) ×1 IMPLANT
FORCEPS PROGRASP DVNC XI (FORCEP) ×1 IMPLANT
FORCEPS TENACULUM DVNC XI (FORCEP) IMPLANT
GAUZE 4X4 16PLY ~~LOC~~+RFID DBL (SPONGE) IMPLANT
GLOVE BIOGEL PI IND STRL 7.0 (GLOVE) ×3 IMPLANT
GLOVE ECLIPSE 7.0 STRL STRAW (GLOVE) ×3 IMPLANT
GOWN STRL REUS W/ TWL XL LVL3 (GOWN DISPOSABLE) ×3 IMPLANT
HEMOSTAT ARISTA ABSORB 3G PWDR (HEMOSTASIS) IMPLANT
HIBICLENS CHG 4% 4OZ BTL (MISCELLANEOUS) ×2 IMPLANT
HOLDER FOLEY CATH W/STRAP (MISCELLANEOUS) ×1 IMPLANT
IRRIGATION SUCT STRKRFLW 2 WTP (MISCELLANEOUS) ×1 IMPLANT
KIT PINK PAD W/HEAD ARM REST (MISCELLANEOUS) ×1 IMPLANT
KIT TURNOVER KIT B (KITS) ×1 IMPLANT
LEGGING LITHOTOMY PAIR STRL (DRAPES) ×1 IMPLANT
MANIFOLD NEPTUNE II (INSTRUMENTS) IMPLANT
NDL INSUFFLATION 14GA 120MM (NEEDLE) IMPLANT
NEEDLE INSUFFLATION 14GA 120MM (NEEDLE) IMPLANT
NS IRRIG 1000ML POUR BTL (IV SOLUTION) ×1 IMPLANT
OBTURATOR OPTICALSTD 8 DVNC (TROCAR) ×1 IMPLANT
OCCLUDER COLPOPNEUMO (BALLOONS) IMPLANT
PACK ROBOT WH (CUSTOM PROCEDURE TRAY) ×1 IMPLANT
PAD OB MATERNITY 11 LF (PERSONAL CARE ITEMS) ×1 IMPLANT
PAD POSITIONING PINK XL (MISCELLANEOUS) ×1 IMPLANT
RUMI II 3.0CM BLUE KOH-EFFICIE (DISPOSABLE) IMPLANT
RUMI II GYRUS 2.5CM BLUE (DISPOSABLE) IMPLANT
RUMI II GYRUS 3.5CM BLUE (DISPOSABLE) IMPLANT
RUMI II GYRUS 4.0CM BLUE (DISPOSABLE) IMPLANT
SCISSORS MNPLR CVD DVNC XI (INSTRUMENTS) ×1 IMPLANT
SEAL UNIV 5-12 XI (MISCELLANEOUS) ×4 IMPLANT
SEALER VESSEL EXT DVNC XI (MISCELLANEOUS) ×1 IMPLANT
SET CYSTO W/LG BORE CLAMP LF (SET/KITS/TRAYS/PACK) ×1 IMPLANT
SET TRI-LUMEN FLTR TB AIRSEAL (TUBING) ×1 IMPLANT
SPIKE FLUID TRANSFER (MISCELLANEOUS) ×1 IMPLANT
SUT VIC AB 0 CT1 27XBRD ANBCTR (SUTURE) IMPLANT
SUT VIC AB 0 CT2 27 (SUTURE) IMPLANT
SUT VIC AB 2-0 SH 27XBRD (SUTURE) IMPLANT
SUT VIC AB 4-0 PS2 18 (SUTURE) ×2 IMPLANT
SUT VICRYL 0 UR6 27IN ABS (SUTURE) IMPLANT
SUT VLOC 180 0 9IN GS21 (SUTURE) ×1 IMPLANT
SYSTEM BAG RETRIEVAL 10MM (BASKET) IMPLANT
TIP RUMI ORANGE 6.7MMX12CM (TIP) IMPLANT
TIP UTERINE 5.1X6CM LAV DISP (MISCELLANEOUS) IMPLANT
TIP UTERINE 6.7X10CM GRN DISP (MISCELLANEOUS) IMPLANT
TIP UTERINE 6.7X6CM WHT DISP (MISCELLANEOUS) IMPLANT
TIP UTERINE 6.7X8CM BLUE DISP (MISCELLANEOUS) IMPLANT
TOWEL GREEN STERILE (TOWEL DISPOSABLE) ×1 IMPLANT
TRAY FOLEY W/BAG SLVR 14FR (SET/KITS/TRAYS/PACK) ×1 IMPLANT
TROCAR PORT AIRSEAL 8X120 (TROCAR) ×1 IMPLANT
UNDERPAD 30X36 HEAVY ABSORB (UNDERPADS AND DIAPERS) ×1 IMPLANT

## 2023-10-19 NOTE — Anesthesia Procedure Notes (Signed)
 Procedure Name: Intubation Date/Time: 10/19/2023 7:43 AM  Performed by: Viviana Almarie DASEN, CRNAPre-anesthesia Checklist: Patient identified, Emergency Drugs available, Suction available and Patient being monitored Patient Re-evaluated:Patient Re-evaluated prior to induction Oxygen Delivery Method: Circle System Utilized Preoxygenation: Pre-oxygenation with 100% oxygen Induction Type: IV induction Ventilation: Mask ventilation without difficulty Laryngoscope Size: Mac and 3 Grade View: Grade I Tube type: Oral Tube size: 6.5 mm Number of attempts: 1 Airway Equipment and Method: Stylet and Bite block Placement Confirmation: ETT inserted through vocal cords under direct vision, positive ETCO2 and breath sounds checked- equal and bilateral Secured at: 20 cm Tube secured with: Tape Dental Injury: Teeth and Oropharynx as per pre-operative assessment

## 2023-10-19 NOTE — Brief Op Note (Signed)
 10/19/2023  10:35 AM  PATIENT:  Joanne Schneider  33 y.o. female  PRE-OPERATIVE DIAGNOSIS:  Abnormal uterine bleeding Fibroid  POST-OPERATIVE DIAGNOSIS:  Abnormal uterine bleeding Fibroid  PROCEDURE:  Procedure(s): LYSIS, ADHESIONS, ROBOT-ASSISTED, LAPAROSCOPIC (N/A) MYOMECTOMY, LAPAROSCOPIC (N/A)  SURGEON:  Surgeons and Role:    DEWAINE Jeralyn Crutch, MD - Primary  PHYSICIAN ASSISTANT: Jorene Moats, PA  ASSISTANTS: none   ANESTHESIA:   general and TAP block  EBL:  50 mL   BLOOD ADMINISTERED:none  DRAINS: none   LOCAL MEDICATIONS USED:  BUPIVICAINE & Exparel   SPECIMEN:  Source of Specimen:  fibroid  DISPOSITION OF SPECIMEN:  PATHOLOGY  COUNTS:  YES  TOURNIQUET:  * No tourniquets in log *  DICTATION: .Note written in EPIC  PLAN OF CARE: Discharge to home after PACU  PATIENT DISPOSITION:  PACU - hemodynamically stable.   Delay start of Pharmacological VTE agent (>24hrs) due to surgical blood loss or risk of bleeding: not applicable

## 2023-10-19 NOTE — H&P (Signed)
 OB/GYN Pre-Op History and Physical  Joanne Schneider is a 33 y.o. No obstetric history on file. presenting for RA-MYOMECTOMY. Currently taking 20mg  daily of mgace which has been helping with the bleeding and avoiding the weight gain       Past Medical History:  Diagnosis Date   Abnormal uterine bleeding (AUB)    Dyspnea    10-13-2023  w/ stairs , long distance walking,  but able to do household work   Iron deficiency anemia due to chronic blood loss    10-13-2023  pt stated had x2 PRBCs approx 05/ 2024  d/t menorrhagia due to uterine fibroids   Mild persistent asthma without complication    10-13-2023  pt stated last flared w/ treatment 05/ 2024/  last used rescue inhaler approx one month ago   Pre-diabetes    Uterine fibroid    Wears glasses     Past Surgical History:  Procedure Laterality Date   CESAREAN SECTION  10/15/2009   @ [facility]   CESAREAN SECTION  09/09/2010   @ [facility]    OB History  No obstetric history on file.    Social History   Socioeconomic History   Marital status: Single    Spouse name: Not on file   Number of children: Not on file   Years of education: Not on file   Highest education level: Not on file  Occupational History   Not on file  Tobacco Use   Smoking status: Never   Smokeless tobacco: Never  Vaping Use   Vaping status: Never Used  Substance and Sexual Activity   Alcohol use: Yes    Comment: occasional   Drug use: Not Currently    Types: Marijuana    Comment: 10-13-2023  pt stated quit smoking marijuana 02/ 2025   Sexual activity: Not on file  Other Topics Concern   Not on file  Social History Narrative   Not on file   Social Drivers of Health   Financial Resource Strain: Not on file  Food Insecurity: Food Insecurity Present (02/25/2023)   Hunger Vital Sign    Worried About Running Out of Food in the Last Year: Often true    Ran Out of Food in the Last Year: Often true  Transportation Needs: No Transportation Needs (02/25/2023)    PRAPARE - Administrator, Civil Service (Medical): No    Lack of Transportation (Non-Medical): No  Physical Activity: Not on file  Stress: Not on file  Social Connections: Not on file    Family History  Problem Relation Age of Onset   Diabetes Father    Cancer Sister     Medications Prior to Admission  Medication Sig Dispense Refill Last Dose/Taking   Fluticasone Propionate, Inhal, 100 MCG/ACT AEPB Inhale 2 puffs into the lungs daily.   10/18/2023   megestrol  (MEGACE ) 20 MG tablet Take 2 tablets (40 mg total) by mouth 2 (two) times daily. Can increase to four tablets (80 mg total) twice a day in the event of heavy bleeding (Patient taking differently: Take 40 mg by mouth daily. Can increase to four tablets (80 mg total) twice a day in the event of heavy bleeding) 120 tablet 5 10/19/2023 Morning   albuterol (VENTOLIN HFA) 108 (90 Base) MCG/ACT inhaler Inhale 1-2 puffs into the lungs every 6 (six) hours as needed for wheezing or shortness of breath.   More than a month   megestrol  (MEGACE ) 40 MG tablet Take 1 tablet (40 mg total) by  mouth 2 (two) times daily. Can increase to two tablets twice a day in the event of heavy bleeding (Patient not taking: Reported on 10/11/2023) 60 tablet 5 Not Taking    Allergies  Allergen Reactions   Shellfish Allergy Hives and Swelling   Fish Allergy     PT STATES AVOIDS ALL FISH ALSO DUE TO SHELLFISH ALLERGY   Latex Other (See Comments)    Pt stated w/ condoms swelling of area,  okay w/ gloves on hands   Oxycodone  Hives and Itching   Peanut Butter Flavoring Agent (Non-Screening) Hives and Itching   Pork-Derived Products     Review of Systems: Negative except for what is mentioned in HPI.     Physical Exam: BP (!) 123/90   Pulse 86   Temp 98.5 F (36.9 C) (Oral)   Resp 17   Ht 5' 5 (1.651 m)   Wt 77.1 kg   LMP 10/11/2023 (Exact Date)   SpO2 98%   BMI 28.29 kg/m  CONSTITUTIONAL: Well-developed, well-nourished and in no acute  distress.  HENT:  Normocephalic, atraumatic, External right and left ear normal. Oropharynx is clear and moist EYES: Conjunctivae and EOM are normal. Pupils are equal, round, and reactive to light. No scleral icterus.  NECK: Normal range of motion, supple, no masses SKIN: Skin is warm and dry. No rash noted. Not diaphoretic. No erythema. No pallor. NEUROLGIC: Alert and oriented to person, place, and time. Normal reflexes, muscle tone coordination. No cranial nerve deficit noted. PSYCHIATRIC: Normal mood and affect. Normal behavior. Normal judgment and thought content. RESPIRATORY: Normal effort PELVIC: Deferred   Pertinent Labs/Studies:   Results for orders placed or performed during the hospital encounter of 10/19/23 (from the past 72 hours)  Pregnancy, urine POC     Status: None   Collection Time: 10/19/23  5:51 AM  Result Value Ref Range   Preg Test, Ur NEGATIVE NEGATIVE    Comment:        THE SENSITIVITY OF THIS METHODOLOGY IS >20 mIU/mL.        Assessment and Plan :Joanne Schneider is a 33 y.o. No obstetric history on file. here for myomectomy.   Patient desires surgical management with AUB-L.  The risks of surgery were discussed in detail with the patient including but not limited to: bleeding which may require transfusion or reoperation; infection which may require prolonged hospitalization or re-hospitalization and antibiotic therapy; injury to bowel, bladder, ureters and major vessels or other surrounding organs which may lead to other procedures; formation of adhesions; need for additional procedures including laparotomy or subsequent procedures secondary to intraoperative injury or abnormal pathology; thromboembolic phenomenon; incisional problems and other postoperative or anesthesia complications.  Patient was told that the likelihood that her condition and symptoms will be treated effectively with this surgical management was moderate to high; the postoperative expectations  were also discussed in detail. The patient also understands the alternative treatment options which were discussed in full. All questions were answered.     Mervin Ramires, M.D. Minimally Invasive Gynecologic Surgery and Pelvic Pain Specialist Attending Obstetrician & Gynecologist, [facility] [employer], [facility]

## 2023-10-19 NOTE — Discharge Instructions (Addendum)
 Post-surgical Instructions, Outpatient Surgery  You may expect to feel dizzy, weak, and drowsy for as long as 24 hours after receiving the medicine that made you sleep (anesthetic). For the first 24 hours after your surgery:   Do not drive a car, ride a bicycle, participate in physical activities, or take public transportation until you are done taking narcotic pain medicines or as directed by Dr. Jeralyn.  Do not drink alcohol or take tranquilizers.  Do not take medicine that has not been prescribed by your physicians.  Do not sign important papers or make important decisions while on narcotic pain medicines.  Have a responsible person with you.   CARE OF INCISION If you have a bandage, you may remove it in one day.  If there are steri-strips or dermabond, just let this loosen on its own.  You may shower on the first day after your surgery.  Do not sit in a tub bath for one week. Avoid heavy lifting (more than 10 pounds/4.5 kilograms), pushing, or pulling.  Avoid activities that may risk injury to your incisions.   PAIN MANAGEMENT Ibuprofen  800mg .  (This is the same as 4-200mg  over the counter tablets of Motrin  or ibuprofen .)  Take this every 6 hours or as needed for cramping.   Acetaminophen  1000mg  (This is the same as 2-500mg  over the counter extra strength tylenol ). Take this every 6 hours for the first 3 days or as needed afterwards for pain Oxycodone  5mg  For more severe pain, take one or two tablets every four to six hours as needed for pain control.  (Remember that narcotic pain medications increase your risk of constipation.  If this becomes a problem, you may take an over the counter laxative like miralax .)  DO'S AND DON'T'S Do not take a tub bath for 4 weeks.  You may shower on the first day after your surgery Do not do any heavy lifting for 4-6 weeks. This increases the chance of bleeding. Do move around as you feel able.  Stairs are fine.  You may begin to exercise again as you  feel able.   Do not put anything in the vagina for two weeks--no tampons, intercourse, or douching.    REGULAR MEDIATIONS/VITAMINS: You may restart all of your regular medications as prescribed. You may restart all of your vitamins as you normally take them.    PLEASE CALL OR SEEK MEDICAL CARE IF: You have persistent nausea and vomiting.  You have trouble eating or drinking.  You have an oral temperature above 100.5.  You have constipation that is not helped by adjusting diet or increasing fluid intake. Pain medicines are a common cause of constipation.  You have heavy vaginal bleeding You have redness or drainage from your incision(s) or there is increasing pain or tenderness near or in the surgical site.    Post Anesthesia Home Care Instructions  Activity: Get plenty of rest for the remainder of the day. A responsible adult should stay with you for 24 hours following the procedure.  For the next 24 hours, DO NOT: -Drive a car -Advertising copywriter -Drink alcoholic beverages -Take any medication unless instructed by your physician -Make any legal decisions or sign important papers.  Meals: Start with liquid foods such as gelatin or soup. Progress to regular foods as tolerated. Avoid greasy, spicy, heavy foods. If nausea and/or vomiting occur, drink only clear liquids until the nausea and/or vomiting subsides. Call your physician if vomiting continues.  Special Instructions/Symptoms: Your throat may feel dry  or sore from the anesthesia or the breathing tube placed in your throat during surgery. If this causes discomfort, gargle with warm salt water. The discomfort should disappear within 24 hours.

## 2023-10-19 NOTE — Transfer of Care (Signed)
 Immediate Anesthesia Transfer of Care Note  Patient: Joanne Schneider  Procedure(s) Performed: LYSIS, ADHESIONS, ROBOT-ASSISTED, LAPAROSCOPIC MYOMECTOMY, LAPAROSCOPIC  Patient Location: PACU  Anesthesia Type:General  Level of Consciousness: awake, alert , oriented, and patient cooperative  Airway & Oxygen Therapy: Patient Spontanous Breathing  Post-op Assessment: Report given to RN, Post -op Vital signs reviewed and stable, Patient moving all extremities X 4, and Patient able to stick tongue midline  Post vital signs: Reviewed and stable  Last Vitals:  Vitals Value Taken Time  BP 145/97 10/19/23 10:49  Temp 97.6   Pulse 110 10/19/23 10:51  Resp 13 10/19/23 10:51  SpO2 100 % 10/19/23 10:51  Vitals shown include unfiled device data.  Last Pain:  Vitals:   10/19/23 9391  TempSrc: Oral  PainSc: 0-No pain      Patients Stated Pain Goal: 3 (10/19/23 9391)  Complications: No notable events documented.

## 2023-10-19 NOTE — Anesthesia Postprocedure Evaluation (Signed)
 Anesthesia Post Note  Patient: Joanne Schneider  Procedure(s) Performed: LYSIS, ADHESIONS, ROBOT-ASSISTED, LAPAROSCOPIC MYOMECTOMY, LAPAROSCOPIC     Patient location during evaluation: PACU Anesthesia Type: General Level of consciousness: awake and alert Pain management: pain level controlled Vital Signs Assessment: post-procedure vital signs reviewed and stable Respiratory status: spontaneous breathing, nonlabored ventilation, respiratory function stable and patient connected to nasal cannula oxygen Cardiovascular status: blood pressure returned to baseline and stable Postop Assessment: no apparent nausea or vomiting Anesthetic complications: no   No notable events documented.  Last Vitals:  Vitals:   10/19/23 1054 10/19/23 1157  BP:    Pulse: (!) 114 (!) 101  Resp: 15 14  Temp: 36.8 C 37 C  SpO2: 98% 99%    Last Pain:  Vitals:   10/19/23 1212  TempSrc:   PainSc: 4                  Epifanio Lamar BRAVO

## 2023-10-19 NOTE — Anesthesia Preprocedure Evaluation (Signed)
 Anesthesia Evaluation  Patient identified by MRN, date of birth, ID band Patient awake    Reviewed: Allergy & Precautions, NPO status , Patient's Chart, lab work & pertinent test results  Airway Mallampati: II  TM Distance: >3 FB Neck ROM: Full    Dental  (+) Dental Advisory Given   Pulmonary asthma    breath sounds clear to auscultation       Cardiovascular negative cardio ROS  Rhythm:Regular Rate:Normal     Neuro/Psych negative neurological ROS     GI/Hepatic negative GI ROS, Neg liver ROS,,,  Endo/Other  negative endocrine ROS    Renal/GU negative Renal ROS     Musculoskeletal   Abdominal   Peds  Hematology  (+) Blood dyscrasia, anemia   Anesthesia Other Findings   Reproductive/Obstetrics                              Anesthesia Physical Anesthesia Plan  ASA: 2  Anesthesia Plan: General   Post-op Pain Management: Tylenol  PO (pre-op)*, Gabapentin  PO (pre-op)* and Toradol  IV (intra-op)*   Induction: Intravenous  PONV Risk Score and Plan: 4 or greater and Dexamethasone , Ondansetron , Midazolam , Scopolamine patch - Pre-op and Treatment may vary due to age or medical condition  Airway Management Planned: Oral ETT  Additional Equipment: None  Intra-op Plan:   Post-operative Plan: Extubation in OR  Informed Consent: I have reviewed the patients History and Physical, chart, labs and discussed the procedure including the risks, benefits and alternatives for the proposed anesthesia with the patient or authorized representative who has indicated his/her understanding and acceptance.     Dental advisory given  Plan Discussed with: CRNA  Anesthesia Plan Comments:         Anesthesia Quick Evaluation

## 2023-10-19 NOTE — Op Note (Signed)
 Joanne Schneider PROCEDURE DATE: 10/19/2023  PREOPERATIVE DIAGNOSIS: abnormal uterine bleeding, fibroid   POSTOPERATIVE DIAGNOSIS: abnormal uterine bleeding, fibroid PROCEDURE:    robotic assisted laparoscopic myomectomy SURGEON: Gaurav Baldree, MD ASSISTANT: Jorene Moats, PA  An experienced assistant was required given the standard of surgical care given the complexity of the case.  This assistant was needed for exposure, dissection, suctioning, retraction, instrument exchange, and for overall help during the procedure.  INDICATIONS: 33 y.o. No obstetric history on file. with AUB-L.  Risks of surgery were discussed with the patient including but not limited to: bleeding which may require transfusion; infection which may require antibiotics; injury to surrounding organs; need for additional procedures including laparotomy;  and other postoperative/anesthesia complications. Written informed consent was obtained.    FINDINGS:  Normal external genitalia, 10 wk size mobile uterus with Normal contours.  Laparoscopically: normal upper abdominal survey with omental adhesion to anterior abdominal wall, enlarged uterus, normal bilateral fallopian tubes, normal bilateral ovaries, normal anterior cul de sac, normal posterior cul de sac  ANESTHESIA: General, TAP block INTRAVENOUS FLUIDS:  1600 ml of LR ESTIMATED BLOOD LOSS:  50 ml URINE OUTPUT: 100 ml SPECIMENS: fibroid COMPLICATIONS:  None immediate.  The risks, benefits, and alternatives of surgery were explained, understood, and accepted. Consents were signed. All questions were answered. She was taken to the operating room and general anesthesia was applied without complication. She was placed in the dorsal lithotomy position and her abdomen and vagina were prepped and draped after she had been carefully positioned on the table. A bimanual exam revealed a 12 week size uterus that was mobile. Her adnexa were not enlarged. A Foley catheter was placed and  it drained clear throughout the case. A speculum was placed and the cervix visualized. A 0 vicryl suture was stitched into the anterior lip of the cervix. The cervix was measured and the uterus was sounded to 10 cm. A Rumi uterine manipulator was placed without difficulty.  Gloves were changed and attention was turned to the abdomen. An 8mm incision was made in the LUQ and an optiview airseal trocar was introduced into the abdomen. The Entry was confirmed with low opening intraabdominal pressure and visualization and the abdomen was then insufflated. After good pneumoperitoneum was established, the abdomen was surveyed including the upper abdomen. A laparoscopic TAP block was completed with a mix of bupivicaine and exparel . She was placed in Trendelenburg position and ports were placed in appropriate positions on her abdomen to allow maximum exposure during the robotic case. Specifically, trocars were placed supraumbilically, in the LLQ, RLQ, and RUQ.  These were all placed under direct laparoscopic visualization. The robot was docked and I proceeded with a robotic portion of the case.  The pelvis was inspected. The omental adhesion was taken down sharply. The uterus was inspected. 20cc of dilute vasopressin  (20u in 100cc) was injected posteriorly. A vertical incision was made in the posterior uterus. The fibroid capsure was encountered and the fibroid was grasped and removed bluntly. The endometrial cavity was encountered at this time. The overlying myometrium was closed in 3 layers using 0 vloc in 3 layers with care not to incorporate the endometrium and the final layer closed in a continuous baseball stitch fashion. The fibroid was placed on a 0 vicryl and attached to the anterior abdominal wall. There appeared to be an additional myoma at the right cornua of the uterus, and so the serosa was injected with vasopressin  and the myometrium incised. After the incision was made, the  tip of the manipulator was  encountered. The manipulator was pulled back and the overlying myometrium was closed in 2 layers and the serosa closed in a baseball stitch fashion. The pelvis was irrigated. The robot was then undocked. The umbilical trocar was removed and the skin incision extended. A 10mm bag endocatch was introduced and the fibroid placed into the bag. The bag was brought up through the bag and manually morcellated until it was able to be removed. The fascia was closed with 0 vicryl in a running fashion. The abdomen was inspected once more and there was no evidence of gross injury. Normal saline was instilled into the abdomen to serve as an adhesion barrier. All remaining trocars were removed. There was brisk bleeding noted from the LUQ incision. A single interrupted incision through the skin was placed and hemostasis was achieved. A single 2-0 deep stitch was placed in the upper portion of the umbilical incision to close a small portion of that incision. All remaining skin was re-approximated with 4-0 vicryl and covered with dermabond.   The patient was then extubated and taken to recovery in stable condition.   Sponge, lap and needle counts were correct x 2.     Carter Quarry, MD Minimally Invasive Gynecologic Surgery  Obstetrics and Gynecology, [facility] for [facility], [facility] 10/19/2023

## 2023-10-20 ENCOUNTER — Encounter (HOSPITAL_COMMUNITY): Payer: Self-pay | Admitting: Obstetrics and Gynecology

## 2023-10-20 LAB — TYPE AND SCREEN
ABO/RH(D): A POS
Antibody Screen: NEGATIVE
Unit division: 0
Unit division: 0

## 2023-10-20 LAB — BPAM RBC
Blood Product Expiration Date: 202508272359
Blood Product Expiration Date: 202508272359
Unit Type and Rh: 6200
Unit Type and Rh: 6200

## 2023-10-21 ENCOUNTER — Ambulatory Visit (INDEPENDENT_AMBULATORY_CARE_PROVIDER_SITE_OTHER): Admitting: Obstetrics and Gynecology

## 2023-10-21 ENCOUNTER — Encounter: Payer: Self-pay | Admitting: Obstetrics and Gynecology

## 2023-10-21 VITALS — BP 121/84 | HR 96

## 2023-10-21 DIAGNOSIS — G8918 Other acute postprocedural pain: Secondary | ICD-10-CM

## 2023-10-21 DIAGNOSIS — D219 Benign neoplasm of connective and other soft tissue, unspecified: Secondary | ICD-10-CM

## 2023-10-21 DIAGNOSIS — N939 Abnormal uterine and vaginal bleeding, unspecified: Secondary | ICD-10-CM

## 2023-10-21 LAB — SURGICAL PATHOLOGY

## 2023-10-21 MED ORDER — GABAPENTIN 300 MG PO CAPS: 300.0000 mg | ORAL_CAPSULE | Freq: Three times a day (TID) | ORAL | 0 refills | Status: DC

## 2023-10-21 MED ORDER — OXYCODONE HCL 5 MG PO TABS: 5.0000 mg | ORAL_TABLET | ORAL | 0 refills | Status: DC | PRN

## 2023-10-22 NOTE — Progress Notes (Signed)
   POSTOPERATIVE VISIT NOTE   Subjective:     Joanne Schneider is a 33 y.o. No obstetric history on file. who presents to the clinic 3 days status post robotic myomectomy for suture removal. Having significant pain. Taking oxycodone , tylenol  and ibuprofen . Sleeps for a while and then wakes up in pain. Passing gas not yet having BM. Tolerating po. Voiding.   Review of Systems Pertinent items are noted in HPI.    Objective:    BP 121/84   Pulse 96   LMP 10/11/2023 (Exact Date)  General:  alert and cooperative  Abdomen: soft, tender  Incision:   healing well, no drainage, no erythema, no hernia, no seroma, no swelling, no dehiscence, incision well approximated  Pelvic:   Exam deferred.    Pathology Results: FINAL MICROSCOPIC DIAGNOSIS:   A. FIBROIDS, MYOMECTOMY:  - Fragments of leiomyoma.    Assessment:   Having postoperative pain - additional oxycodone  and gabapentin  sent to pharmacy  Operative findings again reviewed. Pathology report discussed.   Plan:    1. Abnormal uterine bleeding (AUB) 2. Fibroid 3. Postoperative pain (Primary) Now s/p uncomplicated suture removal. Noted postoperative pain as expected, continue oral analgesia. Noted intraoperative findings. Recommend avoiding pregnancy for ~9 months given depth of fibroid removal.   - gabapentin  (NEURONTIN ) 300 MG capsule; Take 1 capsule (300 mg total) by mouth 3 (three) times daily.  Dispense: 30 capsule; Refill: 0 - oxyCODONE  (OXY IR/ROXICODONE ) 5 MG immediate release tablet; Take 1 tablet (5 mg total) by mouth every 4 (four) hours as needed for severe pain (pain score 7-10) or breakthrough pain.  Dispense: 10 tablet; Refill: 0    Activity restrictions: no lifting more than 10 pounds Follow up: 4 weeks  Carter Quarry, MD Obstetrician & Gynecologist, [facility] for [employer], [facility]

## 2023-10-26 DIAGNOSIS — Z419 Encounter for procedure for purposes other than remedying health state, unspecified: Secondary | ICD-10-CM | POA: Diagnosis not present

## 2023-10-27 ENCOUNTER — Encounter: Payer: Self-pay | Admitting: Obstetrics and Gynecology

## 2023-10-27 DIAGNOSIS — G8918 Other acute postprocedural pain: Secondary | ICD-10-CM

## 2023-10-28 MED ORDER — CLONIDINE HCL 0.1 MG PO TABS: 0.1000 mg | ORAL_TABLET | Freq: Two times a day (BID) | ORAL | 0 refills | Status: DC | PRN

## 2023-10-29 ENCOUNTER — Ambulatory Visit

## 2023-10-29 VITALS — BP 123/83 | HR 90 | Ht 65.0 in | Wt 174.0 lb

## 2023-10-29 DIAGNOSIS — G8918 Other acute postprocedural pain: Secondary | ICD-10-CM

## 2023-10-29 NOTE — Progress Notes (Signed)
 Patient is here today for incision check. Patient had laparoscopic myomectomy on 10/19/23. Patient was seen on 10/21/23 for suture removal in office. Today incision looks clean, dry, intact. Dermabond in place. No s/s of infection noted.   Patient reports pain of 7/10 achy and intermittent sharp pain at belly button laparoscopic site. Reports only taking Ibuprofen  600 mg. Advised patient to take 1000 mg of Tylenol  q6 in addition with Ibuprofen . Also advised patient to apply heat intermittently as well to assist with pain. Patient reports completing PRN Oxycodone . Encouraged ambulation and frequent rest.   Educated patient on daily wound care and s/s of any infection. Patient reports that she has not picked up her prescription clonidine  that the physician sent in yesterday. Reports she will pick it up after this appointment.   All questions addressed and advised patient to contact our office of any questions or concerns. Patient voiced understanding.

## 2023-11-22 ENCOUNTER — Emergency Department (HOSPITAL_COMMUNITY)

## 2023-11-22 ENCOUNTER — Inpatient Hospital Stay (HOSPITAL_COMMUNITY)
Admission: EM | Admit: 2023-11-22 | Discharge: 2023-11-24 | DRG: 638 | Disposition: A | Discharge status: Home / Self Care | Attending: Internal Medicine | Admitting: Internal Medicine

## 2023-11-22 DIAGNOSIS — Z91040 Latex allergy status: Secondary | ICD-10-CM | POA: Diagnosis not present

## 2023-11-22 DIAGNOSIS — E1110 Type 2 diabetes mellitus with ketoacidosis without coma: Secondary | ICD-10-CM | POA: Diagnosis not present

## 2023-11-22 DIAGNOSIS — Z743 Need for continuous supervision: Secondary | ICD-10-CM | POA: Diagnosis not present

## 2023-11-22 DIAGNOSIS — Z91013 Allergy to seafood: Secondary | ICD-10-CM | POA: Diagnosis not present

## 2023-11-22 DIAGNOSIS — N179 Acute kidney failure, unspecified: Secondary | ICD-10-CM | POA: Diagnosis not present

## 2023-11-22 DIAGNOSIS — Z833 Family history of diabetes mellitus: Secondary | ICD-10-CM

## 2023-11-22 DIAGNOSIS — E0810 Diabetes mellitus due to underlying condition with ketoacidosis without coma: Secondary | ICD-10-CM | POA: Diagnosis not present

## 2023-11-22 DIAGNOSIS — Z885 Allergy status to narcotic agent status: Secondary | ICD-10-CM | POA: Diagnosis not present

## 2023-11-22 DIAGNOSIS — E860 Dehydration: Secondary | ICD-10-CM | POA: Diagnosis not present

## 2023-11-22 DIAGNOSIS — D219 Benign neoplasm of connective and other soft tissue, unspecified: Secondary | ICD-10-CM | POA: Diagnosis present

## 2023-11-22 DIAGNOSIS — E139 Other specified diabetes mellitus without complications: Secondary | ICD-10-CM | POA: Diagnosis not present

## 2023-11-22 DIAGNOSIS — Z9102 Food additives allergy status: Secondary | ICD-10-CM | POA: Diagnosis not present

## 2023-11-22 DIAGNOSIS — E875 Hyperkalemia: Secondary | ICD-10-CM | POA: Diagnosis not present

## 2023-11-22 DIAGNOSIS — Z91014 Allergy to mammalian meats: Secondary | ICD-10-CM

## 2023-11-22 DIAGNOSIS — R112 Nausea with vomiting, unspecified: Secondary | ICD-10-CM | POA: Diagnosis not present

## 2023-11-22 DIAGNOSIS — Z862 Personal history of diseases of the blood and blood-forming organs and certain disorders involving the immune mechanism: Secondary | ICD-10-CM

## 2023-11-22 DIAGNOSIS — Z794 Long term (current) use of insulin: Secondary | ICD-10-CM | POA: Diagnosis not present

## 2023-11-22 DIAGNOSIS — E1310 Other specified diabetes mellitus with ketoacidosis without coma: Secondary | ICD-10-CM | POA: Diagnosis not present

## 2023-11-22 DIAGNOSIS — I10 Essential (primary) hypertension: Secondary | ICD-10-CM | POA: Diagnosis not present

## 2023-11-22 DIAGNOSIS — D259 Leiomyoma of uterus, unspecified: Secondary | ICD-10-CM | POA: Diagnosis not present

## 2023-11-22 DIAGNOSIS — E119 Type 2 diabetes mellitus without complications: Secondary | ICD-10-CM

## 2023-11-22 DIAGNOSIS — Z79899 Other long term (current) drug therapy: Secondary | ICD-10-CM | POA: Diagnosis not present

## 2023-11-22 DIAGNOSIS — R109 Unspecified abdominal pain: Secondary | ICD-10-CM | POA: Diagnosis not present

## 2023-11-22 LAB — URINALYSIS, ROUTINE W REFLEX MICROSCOPIC
Bacteria, UA: NONE SEEN
Bilirubin Urine: NEGATIVE
Glucose, UA: >=500 mg/dL — AB
Ketones, ur: 80 mg/dL — AB
Leukocytes,Ua: NEGATIVE
Nitrite: NEGATIVE
Protein, ur: 30 mg/dL — AB
Specific Gravity, Urine: 1.024 (ref 1.005–1.030)
pH: 5 (ref 5.0–8.0)

## 2023-11-22 LAB — I-STAT CHEM 8, ED
BUN: 31 mg/dL — ABNORMAL HIGH (ref 6–20)
Calcium, Ion: 1.15 mmol/L (ref 1.15–1.40)
Chloride: 93 mmol/L — ABNORMAL LOW (ref 98–111)
Creatinine, Ser: 1.5 mg/dL — ABNORMAL HIGH (ref 0.44–1.00)
Glucose, Bld: >700 mg/dL (ref 70–99)
HCT: 55 % — ABNORMAL HIGH (ref 36.0–46.0)
Hemoglobin: 18.7 g/dL — ABNORMAL HIGH (ref 12.0–15.0)
Potassium: 5.4 mmol/L — ABNORMAL HIGH (ref 3.5–5.1)
Sodium: 119 mmol/L — CL (ref 135–145)
TCO2: 9 mmol/L — ABNORMAL LOW (ref 22–32)

## 2023-11-22 LAB — CBC WITH DIFFERENTIAL/PLATELET
Abs Immature Granulocytes: 0.08 K/uL — ABNORMAL HIGH (ref 0.00–0.07)
Basophils Absolute: 0 K/uL (ref 0.0–0.1)
Basophils Relative: 0 %
Eosinophils Absolute: 0 K/uL (ref 0.0–0.5)
Eosinophils Relative: 0 %
HCT: 49.2 % — ABNORMAL HIGH (ref 36.0–46.0)
Hemoglobin: 15.6 g/dL — ABNORMAL HIGH (ref 12.0–15.0)
Immature Granulocytes: 1 %
Lymphocytes Relative: 12 %
Lymphs Abs: 1.6 K/uL (ref 0.7–4.0)
MCH: 27.6 pg (ref 26.0–34.0)
MCHC: 31.7 g/dL (ref 30.0–36.0)
MCV: 86.9 fL (ref 80.0–100.0)
Monocytes Absolute: 0.9 K/uL (ref 0.1–1.0)
Monocytes Relative: 7 %
Neutro Abs: 10.9 K/uL — ABNORMAL HIGH (ref 1.7–7.7)
Neutrophils Relative %: 80 %
Platelets: 305 K/uL (ref 150–400)
RBC: 5.66 MIL/uL — ABNORMAL HIGH (ref 3.87–5.11)
RDW: 13.5 % (ref 11.5–15.5)
WBC: 13.5 K/uL — ABNORMAL HIGH (ref 4.0–10.5)
nRBC: 0 % (ref 0.0–0.2)

## 2023-11-22 LAB — COMPREHENSIVE METABOLIC PANEL WITH GFR
ALT: 34 U/L (ref 0–44)
AST: 15 U/L (ref 15–41)
Albumin: 4.9 g/dL (ref 3.5–5.0)
Alkaline Phosphatase: 144 U/L — ABNORMAL HIGH (ref 38–126)
BUN: 28 mg/dL — ABNORMAL HIGH (ref 6–20)
CO2: <7 mmol/L — ABNORMAL LOW (ref 22–32)
Calcium: 10 mg/dL (ref 8.9–10.3)
Chloride: 78 mmol/L — ABNORMAL LOW (ref 98–111)
Creatinine, Ser: 1.71 mg/dL — ABNORMAL HIGH (ref 0.44–1.00)
GFR, Estimated: 40 mL/min — ABNORMAL LOW (ref >60)
Glucose, Bld: 906 mg/dL (ref 70–99)
Potassium: 5.4 mmol/L — ABNORMAL HIGH (ref 3.5–5.1)
Sodium: 119 mmol/L — CL (ref 135–145)
Total Bilirubin: 0.6 mg/dL (ref 0.0–1.2)
Total Protein: 8.7 g/dL — ABNORMAL HIGH (ref 6.5–8.1)

## 2023-11-22 LAB — BLOOD GAS, VENOUS
Acid-base deficit: 21.9 mmol/L — ABNORMAL HIGH (ref 0.0–2.0)
Bicarbonate: 6.7 mmol/L — ABNORMAL LOW (ref 20.0–28.0)
O2 Saturation: 71.3 %
Patient temperature: 37
pCO2, Ven: 23 mmHg — ABNORMAL LOW (ref 44–60)
pH, Ven: 7.07 — CL (ref 7.25–7.43)
pO2, Ven: 45 mmHg (ref 32–45)

## 2023-11-22 LAB — MAGNESIUM: Magnesium: 2.5 mg/dL — ABNORMAL HIGH (ref 1.7–2.4)

## 2023-11-22 LAB — BASIC METABOLIC PANEL WITH GFR
Anion gap: 22 — ABNORMAL HIGH (ref 5–15)
BUN: 21 mg/dL — ABNORMAL HIGH (ref 6–20)
CO2: 12 mmol/L — ABNORMAL LOW (ref 22–32)
Calcium: 9.3 mg/dL (ref 8.9–10.3)
Chloride: 100 mmol/L (ref 98–111)
Creatinine, Ser: 1.06 mg/dL — ABNORMAL HIGH (ref 0.44–1.00)
GFR, Estimated: >60 mL/min (ref >60)
Glucose, Bld: 149 mg/dL — ABNORMAL HIGH (ref 70–99)
Potassium: 4.3 mmol/L (ref 3.5–5.1)
Sodium: 134 mmol/L — ABNORMAL LOW (ref 135–145)

## 2023-11-22 LAB — GLUCOSE, CAPILLARY
Glucose-Capillary: 125 mg/dL — ABNORMAL HIGH (ref 70–99)
Glucose-Capillary: 159 mg/dL — ABNORMAL HIGH (ref 70–99)
Glucose-Capillary: 172 mg/dL — ABNORMAL HIGH (ref 70–99)
Glucose-Capillary: 177 mg/dL — ABNORMAL HIGH (ref 70–99)
Glucose-Capillary: 231 mg/dL — ABNORMAL HIGH (ref 70–99)
Glucose-Capillary: 341 mg/dL — ABNORMAL HIGH (ref 70–99)

## 2023-11-22 LAB — CBG MONITORING, ED
Glucose-Capillary: >600 mg/dL (ref 70–99)
Glucose-Capillary: >600 mg/dL (ref 70–99)
Glucose-Capillary: >600 mg/dL (ref 70–99)
Glucose-Capillary: 342 mg/dL — ABNORMAL HIGH (ref 70–99)

## 2023-11-22 LAB — MRSA NEXT GEN BY PCR, NASAL: MRSA by PCR Next Gen: NOT DETECTED

## 2023-11-22 LAB — HCG, QUANTITATIVE, PREGNANCY: hCG, Beta Chain, Quant, S: <1 m[IU]/mL (ref <5)

## 2023-11-22 LAB — LIPASE, BLOOD: Lipase: 45 U/L (ref 11–51)

## 2023-11-22 LAB — HCG, SERUM, QUALITATIVE: Preg, Serum: NEGATIVE

## 2023-11-22 MED ORDER — LACTATED RINGERS IV SOLN: INTRAVENOUS | Status: AC

## 2023-11-22 MED ORDER — DEXTROSE IN LACTATED RINGERS 5 % IV SOLN: INTRAVENOUS | Status: AC

## 2023-11-22 MED ORDER — LACTATED RINGERS IV SOLN: INTRAVENOUS | Status: DC

## 2023-11-22 MED ORDER — PROCHLORPERAZINE EDISYLATE 10 MG/2ML IJ SOLN
5.0000 mg | Freq: Four times a day (QID) | INTRAMUSCULAR | Status: DC | PRN
  Administered 2023-11-23: 5 mg via INTRAVENOUS
  Filled 2023-11-22: qty 2

## 2023-11-22 MED ORDER — INSULIN REGULAR(HUMAN) IN NACL 100-0.9 UT/100ML-% IV SOLN
INTRAVENOUS | Status: DC
  Administered 2023-11-22: 11.5 [IU]/h via INTRAVENOUS
  Filled 2023-11-22: qty 100

## 2023-11-22 MED ORDER — INSULIN REGULAR(HUMAN) IN NACL 100-0.9 UT/100ML-% IV SOLN
INTRAVENOUS | Status: DC
  Administered 2023-11-22: 8 [IU]/h via INTRAVENOUS

## 2023-11-22 MED ORDER — CHLORHEXIDINE GLUCONATE CLOTH 2 % EX PADS
6.0000 | MEDICATED_PAD | Freq: Every day | CUTANEOUS | Status: DC
  Administered 2023-11-23 – 2023-11-24 (×2): 6 via TOPICAL

## 2023-11-22 MED ORDER — KETOROLAC TROMETHAMINE 30 MG/ML IJ SOLN
30.0000 mg | Freq: Once | INTRAMUSCULAR | Status: AC
  Administered 2023-11-22: 30 mg via INTRAVENOUS
  Filled 2023-11-22: qty 1

## 2023-11-22 MED ORDER — INSULIN ASPART 100 UNIT/ML IJ SOLN
20.0000 [IU] | Freq: Once | INTRAMUSCULAR | Status: AC
  Administered 2023-11-22: 20 [IU] via SUBCUTANEOUS
  Filled 2023-11-22: qty 0.2

## 2023-11-22 MED ORDER — DEXTROSE IN LACTATED RINGERS 5 % IV SOLN: INTRAVENOUS | Status: DC

## 2023-11-22 MED ORDER — DEXTROSE 50 % IV SOLN: 0.0000 mL | INTRAVENOUS | Status: DC | PRN

## 2023-11-22 MED ORDER — ORAL CARE MOUTH RINSE: 15.0000 mL | OROMUCOSAL | Status: DC | PRN

## 2023-11-22 MED ORDER — ONDANSETRON HCL 4 MG/2ML IJ SOLN
4.0000 mg | Freq: Once | INTRAMUSCULAR | Status: AC
  Administered 2023-11-22: 4 mg via INTRAVENOUS
  Filled 2023-11-22: qty 2

## 2023-11-22 MED ORDER — KETOROLAC TROMETHAMINE 15 MG/ML IJ SOLN
15.0000 mg | Freq: Once | INTRAMUSCULAR | Status: AC
  Administered 2023-11-22: 15 mg via INTRAVENOUS
  Filled 2023-11-22: qty 1

## 2023-11-22 MED ORDER — ONDANSETRON HCL 4 MG/2ML IJ SOLN: 4.0000 mg | Freq: Four times a day (QID) | INTRAMUSCULAR | Status: DC | PRN

## 2023-11-22 MED ORDER — SODIUM CHLORIDE 0.9 % IV BOLUS
2000.0000 mL | Freq: Once | INTRAVENOUS | Status: AC
  Administered 2023-11-22: 2000 mL via INTRAVENOUS

## 2023-11-22 MED ORDER — LACTATED RINGERS IV BOLUS
20.0000 mL/kg | Freq: Once | INTRAVENOUS | Status: AC
  Administered 2023-11-22: 1588 mL via INTRAVENOUS

## 2023-11-22 MED ORDER — ACETAMINOPHEN 650 MG RE SUPP: 325.0000 mg | RECTAL | Status: DC | PRN

## 2023-11-22 NOTE — ED Triage Notes (Signed)
 BIB EMS from home. C/o N/V for 3 days - progressively worse. C/o increased urination and thirst. CBG of 311 for EMS.

## 2023-11-22 NOTE — ED Provider Notes (Signed)
 [place] EMERGENCY DEPARTMENT AT [facility] Provider Note   CSN: [id number] Arrival date & time: 11/22/23  1316     Patient presents with: Hyperglycemia   Joanne Schneider is a 33 y.o. female.   Patient states she has been vomiting for 3 days.  She complains of mild aches throughout.  Patient is status post myomectomy about a month ago  The history is provided by the patient and medical records. No language interpreter was used.  Hyperglycemia Blood sugar level PTA:  Greater than 600 Severity:  Moderate Onset quality:  Sudden Timing:  Constant Progression:  Waxing and waning Chronicity:  New Diabetes status:  Non-diabetic Associated symptoms: vomiting   Associated symptoms: no abdominal pain, no chest pain and no fatigue        Prior to Admission medications   Medication Sig Start Date End Date Taking? Authorizing Provider  acetaminophen  (TYLENOL ) 500 MG tablet Take 2 tablets (1,000 mg total) by mouth every 6 (six) hours as needed for moderate pain (pain score 4-6) or mild pain (pain score 1-3). Patient not taking: Reported on 10/29/2023 10/19/23   Ajewole, Christana, MD  albuterol (VENTOLIN HFA) 108 (90 Base) MCG/ACT inhaler Inhale 1-2 puffs into the lungs every 6 (six) hours as needed for wheezing or shortness of breath.    [provider]  cloNIDine  (CATAPRES ) 0.1 MG tablet Take 1 tablet (0.1 mg total) by mouth 2 (two) times daily as needed (pain). Patient not taking: Reported on 10/29/2023 10/28/23   Ajewole, Christana, MD  Fluticasone Propionate, Inhal, 100 MCG/ACT AEPB Inhale 2 puffs into the lungs daily.    [provider]  gabapentin  (NEURONTIN ) 300 MG capsule Take 1 capsule (300 mg total) by mouth 3 (three) times daily. Patient not taking: Reported on 10/29/2023 10/21/23   Ajewole, Christana, MD  ibuprofen  (ADVIL ) 600 MG tablet Take 1 tablet (600 mg total) by mouth every 6 (six) hours as needed. 10/19/23   Ajewole, Christana, MD  megestrol  (MEGACE )  20 MG tablet Take 2 tablets (40 mg total) by mouth 2 (two) times daily. Can increase to four tablets (80 mg total) twice a day in the event of heavy bleeding Patient not taking: Reported on 10/29/2023 10/01/23   Lola Donnice HERO, MD  polyethylene glycol powder (GLYCOLAX /MIRALAX ) 17 GM/SCOOP powder Take 17 g by mouth daily. 10/19/23   Ajewole, Christana, MD    Allergies: Shellfish allergy, Fish allergy, Latex, Oxycodone , Peanut butter flavoring agent (non-screening), and Pork-derived products    Review of Systems  Constitutional:  Negative for appetite change and fatigue.  HENT:  Negative for congestion, ear discharge and sinus pressure.   Eyes:  Negative for discharge.  Respiratory:  Negative for cough.   Cardiovascular:  Negative for chest pain.  Gastrointestinal:  Positive for vomiting. Negative for abdominal pain and diarrhea.  Genitourinary:  Negative for frequency and hematuria.  Musculoskeletal:  Negative for back pain.  Skin:  Negative for rash.  Neurological:  Negative for seizures and headaches.  Psychiatric/Behavioral:  Negative for hallucinations.     Updated Vital Signs BP (!) 105/94 (BP Location: Left Arm)   Pulse (!) 115   Temp 97.9 F (36.6 C) (Axillary)   Resp 19   SpO2 100%   Physical Exam Vitals and nursing note reviewed.  Constitutional:      Appearance: She is well-developed.  HENT:     Head: Normocephalic.     Nose: Nose normal.  Eyes:     General: No scleral icterus.  Conjunctiva/sclera: Conjunctivae normal.  Neck:     Thyroid: No thyromegaly.  Cardiovascular:     Rate and Rhythm: Normal rate and regular rhythm.     Heart sounds: No murmur heard.    No friction rub. No gallop.  Pulmonary:     Breath sounds: No stridor. No wheezing or rales.  Chest:     Chest wall: No tenderness.  Abdominal:     General: There is no distension.     Tenderness: There is no abdominal tenderness. There is no rebound.  Musculoskeletal:        General: Normal range  of motion.     Cervical back: Neck supple.  Lymphadenopathy:     Cervical: No cervical adenopathy.  Skin:    Findings: No erythema or rash.  Neurological:     Mental Status: She is alert and oriented to person, place, and time.     Motor: No abnormal muscle tone.     Coordination: Coordination normal.  Psychiatric:        Behavior: Behavior normal.     (all labs ordered are listed, but only abnormal results are displayed) Labs Reviewed  CBC WITH DIFFERENTIAL/PLATELET - Abnormal; Notable for the following components:      Result Value   WBC 13.5 (*)    RBC 5.66 (*)    Hemoglobin 15.6 (*)    HCT 49.2 (*)    Neutro Abs 10.9 (*)    Abs Immature Granulocytes 0.08 (*)    All other components within normal limits  BLOOD GAS, VENOUS - Abnormal; Notable for the following components:   pH, Ven 7.07 (*)    pCO2, Ven 23 (*)    Bicarbonate 6.7 (*)    Acid-base deficit 21.9 (*)    All other components within normal limits  I-STAT CHEM 8, ED - Abnormal; Notable for the following components:   Sodium 119 (*)    Potassium 5.4 (*)    Chloride 93 (*)    BUN 31 (*)    Creatinine, Ser 1.50 (*)    Glucose, Bld >700 (*)    TCO2 9 (*)    Hemoglobin 18.7 (*)    HCT 55.0 (*)    All other components within normal limits  CBG MONITORING, ED - Abnormal; Notable for the following components:   Glucose-Capillary >600 (*)    All other components within normal limits  COMPREHENSIVE METABOLIC PANEL WITH GFR  LIPASE, BLOOD  HCG, QUANTITATIVE, PREGNANCY  URINALYSIS, ROUTINE W REFLEX MICROSCOPIC  BETA-HYDROXYBUTYRIC ACID  HCG, SERUM, QUALITATIVE  LACTIC ACID, PLASMA  LACTIC ACID, PLASMA  CBG MONITORING, ED    EKG: None  Radiology: No results found.   Procedures   Medications Ordered in the ED  lactated ringers  bolus 20 mL/kg (has no administration in time range)  insulin  regular, human (MYXREDLIN ) 100 units/ 100 mL infusion (has no administration in time range)  lactated ringers   infusion (has no administration in time range)  dextrose  5 % in lactated ringers  infusion (has no administration in time range)  dextrose  50 % solution 0-50 mL (has no administration in time range)  sodium chloride  0.9 % bolus 2,000 mL (2,000 mLs Intravenous New Bag/Given 11/22/23 1442)  ondansetron  (ZOFRAN ) injection 4 mg (4 mg Intravenous Given 11/22/23 1441)  ketorolac  (TORADOL ) 30 MG/ML injection 30 mg (30 mg Intravenous Given 11/22/23 1441)  insulin  aspart (novoLOG ) injection 20 Units (20 Units Subcutaneous Given 11/22/23 1457)   Patient in DKA.  I spoke with critical care and  they felt like the hospitalist could admit the patient.      CRITICAL CARE Performed by: Fairy Sermon Total critical care time: 45 minutes Critical care time was exclusive of separately billable procedures and treating other patients. Critical care was necessary to treat or prevent imminent or life-threatening deterioration. Critical care was time spent personally by me on the following activities: development of treatment plan with patient and/or surrogate as well as nursing, discussions with consultants, evaluation of patient's response to treatment, examination of patient, obtaining history from patient or surrogate, ordering and performing treatments and interventions, ordering and review of laboratory studies, ordering and review of radiographic studies, pulse oximetry and re-evaluation of patient's condition.                                Medical Decision Making Amount and/or Complexity of Data Reviewed Labs: ordered. Radiology: ordered.  Risk Prescription drug management. Decision regarding hospitalization.   New onset DKA     Final diagnoses:  Diabetic ketoacidosis without coma associated with diabetes mellitus due to underlying condition [facility])    ED Discharge Orders     None          Sermon Fairy, MD 11/22/23 1542

## 2023-11-22 NOTE — H&P (Signed)
 [facility] H&P    Patient Demographics:    Joanne Schneider, is a 33 y.o. female  MRN: [id number]  DOB - 08/14/1990  Admit Date - 11/22/2023  Referring MD/NP/PA: Dr. Suzette  Outpatient Primary MD for the patient is Pcp, No  Patient coming from: Home  Chief complaint-vomiting   HPI:    Joanne Schneider  is a 33 y.o. female, with no significant medical problems came to hospital vomiting for 3 days.  Patient says that vomiting started 3 days ago and she was having hard time keeping food down.  She also had abdominal pain due to vomiting.  Denies diarrhea.  Complains of having chills and subjective fever.  Denies dysuria.  Denies chest pain or shortness of breath.  Admits to passing out due to vomiting.  Also has been feeling dizzy. Patient has experienced increased urination and thirst. In the ED lab work revealed new onset diabetes mellitus, and patient was found to be in DKA.  Started on IV insulin  per DKA protocol.  Lab work showed blood glucose 906, sodium 119, potassium 5.4, CO2 less than 7.  ABG showed pH of 7.07.     Review of systems:    In addition to the HPI above,    All other systems reviewed and are negative.    Past History of the following :    Past Medical History:  Diagnosis Date   Abnormal uterine bleeding (AUB)    Dyspnea    10-13-2023  w/ stairs , long distance walking,  but able to do household work   Iron deficiency anemia due to chronic blood loss    10-13-2023  pt stated had x2 PRBCs approx 05/ 2024  d/t menorrhagia due to uterine fibroids   Mild persistent asthma without complication    10-13-2023  pt stated last flared w/ treatment 05/ 2024/  last used rescue inhaler approx one month ago   Pre-diabetes    Uterine fibroid    Wears glasses       Past Surgical History:  Procedure Laterality Date   CESAREAN SECTION  10/15/2009   @ [facility]   CESAREAN SECTION  09/09/2010   @ [facility]    MYOMECTOMY, LAPAROSCOPIC N/A 10/19/2023   Procedure: MYOMECTOMY, LAPAROSCOPIC;  Surgeon: Jeralyn Crutch, MD;  Location: [facility] OR;  Service: Gynecology;  Laterality: N/A;   ROBOTIC ASSISTED LAPAROSCOPIC LYSIS OF ADHESION N/A 10/19/2023   Procedure: LYSIS, ADHESIONS, ROBOT-ASSISTED, LAPAROSCOPIC;  Surgeon: Jeralyn Crutch, MD;  Location: [facility] OR;  Service: Gynecology;  Laterality: N/A;      Social History:      Social History   Tobacco Use   Smoking status: Never   Smokeless tobacco: Never  Substance Use Topics   Alcohol use: Yes    Comment: occasional       Family History :     Family History  Problem Relation Age of Onset   Diabetes Father    Cancer Sister       Home Medications:   Prior to Admission medications   Medication Sig Start Date End  Date Taking? Authorizing Provider  albuterol (VENTOLIN HFA) 108 (90 Base) MCG/ACT inhaler Inhale 1-2 puffs into the lungs every 6 (six) hours as needed for wheezing or shortness of breath.   Yes [provider]  Fluticasone Propionate, Inhal, 100 MCG/ACT AEPB Inhale 2 puffs into the lungs daily.   Yes [provider]  ibuprofen  (ADVIL ) 600 MG tablet Take 1 tablet (600 mg total) by mouth every 6 (six) hours as needed. 10/19/23  Yes Ajewole, Christana, MD  cloNIDine  (CATAPRES ) 0.1 MG tablet Take 1 tablet (0.1 mg total) by mouth 2 (two) times daily as needed (pain). Patient not taking: No sig reported 10/28/23   Ajewole, Christana, MD  megestrol  (MEGACE ) 20 MG tablet Take 2 tablets (40 mg total) by mouth 2 (two) times daily. Can increase to four tablets (80 mg total) twice a day in the event of heavy bleeding Patient not taking: No sig reported 10/01/23   Lola Donnice HERO, MD  polyethylene glycol powder (GLYCOLAX /MIRALAX ) 17 GM/SCOOP powder Take 17 g by mouth daily. Patient not taking: Reported on 11/22/2023 10/19/23   Ajewole, Christana, MD     Allergies:     Allergies  Allergen Reactions   Shellfish Allergy Hives and  Swelling   Fish Allergy     PT STATES AVOIDS ALL FISH ALSO DUE TO SHELLFISH ALLERGY   Latex Other (See Comments)    Pt stated w/ condoms swelling of area,  okay w/ gloves on hands   Oxycodone  Hives and Itching   Peanut Butter Flavoring Agent (Non-Screening) Hives and Itching   Pork-Derived Products      Physical Exam:   Vitals  Blood pressure (!) 105/94, pulse (!) 115, temperature 97.9 F (36.6 C), temperature source Axillary, resp. rate 19, weight 79.4 kg, SpO2 100%.  1.  General: Appears in no acute distress  2. Psychiatric: Alert, oriented x 3, intact insight and judgment  3. Neurologic: Cranial nerves II through XII grossly intact, no focal deficit noted  4. HEENMT:  Atraumatic normocephalic, extraocular muscles are intact  5. Respiratory : Lungs are clear to auscultation bilaterally  6. Cardiovascular : S1-S2, regular, no murmur auscultated  7. Gastrointestinal:  Abdomen is soft, nontender, no organomegaly     Data Review:    CBC Recent Labs  Lab 11/22/23 1443 11/22/23 1457  WBC 13.5*  --   HGB 15.6* 18.7*  HCT 49.2* 55.0*  PLT 305  --   MCV 86.9  --   MCH 27.6  --   MCHC 31.7  --   RDW 13.5  --   LYMPHSABS 1.6  --   MONOABS 0.9  --   EOSABS 0.0  --   BASOSABS 0.0  --    ------------------------------------------------------------------------------------------------------------------  Results for orders placed or performed during the hospital encounter of 11/22/23 (from the past 48 hours)  CBG monitoring, ED     Status: Abnormal   Collection Time: 11/22/23  1:50 PM  Result Value Ref Range   Glucose-Capillary >600 (HH) 70 - 99 mg/dL    Comment: Glucose reference range applies only to samples taken after fasting for at least 8 hours.  Urinalysis, Routine w reflex microscopic -Urine, Clean Catch     Status: Abnormal   Collection Time: 11/22/23  2:21 PM  Result Value Ref Range   Color, Urine STRAW (A) YELLOW   APPearance CLEAR CLEAR   Specific  Gravity, Urine 1.024 1.005 - 1.030   pH 5.0 5.0 - 8.0   Glucose, UA >=500 (A) NEGATIVE  mg/dL   Hgb urine dipstick MODERATE (A) NEGATIVE   Bilirubin Urine NEGATIVE NEGATIVE   Ketones, ur 80 (A) NEGATIVE mg/dL   Protein, ur 30 (A) NEGATIVE mg/dL   Nitrite NEGATIVE NEGATIVE   Leukocytes,Ua NEGATIVE NEGATIVE   RBC / HPF 0-5 0 - 5 RBC/hpf   WBC, UA 0-5 0 - 5 WBC/hpf   Bacteria, UA NONE SEEN NONE SEEN   Squamous Epithelial / HPF 0-5 0 - 5 /HPF   Hyaline Casts, UA PRESENT     Comment: Performed at [facility], [address] [place], [place] [address]  CBC with Differential     Status: Abnormal   Collection Time: 11/22/23  2:43 PM  Result Value Ref Range   WBC 13.5 (H) 4.0 - 10.5 K/uL   RBC 5.66 (H) 3.87 - 5.11 MIL/uL   Hemoglobin 15.6 (H) 12.0 - 15.0 g/dL   HCT 50.7 (H) 63.9 - 53.9 %   MCV 86.9 80.0 - 100.0 fL   MCH 27.6 26.0 - 34.0 pg   MCHC 31.7 30.0 - 36.0 g/dL   RDW 86.4 88.4 - 84.4 %   Platelets 305 150 - 400 K/uL   nRBC 0.0 0.0 - 0.2 %   Neutrophils Relative % 80 %   Neutro Abs 10.9 (H) 1.7 - 7.7 K/uL   Lymphocytes Relative 12 %   Lymphs Abs 1.6 0.7 - 4.0 K/uL   Monocytes Relative 7 %   Monocytes Absolute 0.9 0.1 - 1.0 K/uL   Eosinophils Relative 0 %   Eosinophils Absolute 0.0 0.0 - 0.5 K/uL   Basophils Relative 0 %   Basophils Absolute 0.0 0.0 - 0.1 K/uL   Immature Granulocytes 1 %   Abs Immature Granulocytes 0.08 (H) 0.00 - 0.07 K/uL    Comment: Performed at [facility], [address] [place], [place] [address]  Comprehensive metabolic panel     Status: Abnormal   Collection Time: 11/22/23  2:43 PM  Result Value Ref Range   Sodium 119 (LL) 135 - 145 mmol/L    Comment: Critical Value, Read Back and verified with LEONA CROME RN AT 1613 ON 11/22/2023 BY PRUDY, K   Potassium 5.4 (H) 3.5 - 5.1 mmol/L   Chloride 78 (L) 98 - 111 mmol/L   CO2 <7 (L) 22 - 32 mmol/L   Glucose, Bld 906 (HH) 70 - 99 mg/dL    Comment: Critical Value, Read  Back and verified with WAITES, L RN AT 1613 ON 11/22/2023 BY XIONG, K Glucose reference range applies only to samples taken after fasting for at least 8 hours.    BUN 28 (H) 6 - 20 mg/dL   Creatinine, Ser 8.28 (H) 0.44 - 1.00 mg/dL   Calcium 89.9 8.9 - 89.6 mg/dL   Total Protein 8.7 (H) 6.5 - 8.1 g/dL   Albumin 4.9 3.5 - 5.0 g/dL   AST 15 15 - 41 U/L   ALT 34 0 - 44 U/L   Alkaline Phosphatase 144 (H) 38 - 126 U/L   Total Bilirubin 0.6 0.0 - 1.2 mg/dL   GFR, Estimated 40 (L) >60 mL/min    Comment: (NOTE) Calculated using the CKD-EPI Creatinine Equation (2021)    Anion gap NOT CALCULATED 5 - 15    Comment: Performed at [facility], [address], [place], [place] [address]  Lipase, blood     Status: None   Collection Time: 11/22/23  2:43 PM  Result Value Ref Range  Lipase 45 11 - 51 U/L    Comment: Performed at [facility], [address] [place], [place] [address]  hCG, quantitative, pregnancy     Status: None   Collection Time: 11/22/23  2:43 PM  Result Value Ref Range   hCG, Beta Chain, Quant, S <1 <5 mIU/mL    Comment:          GEST. AGE      CONC.  (mIU/mL)   <=1 WEEK        5 - 50     2 WEEKS       50 - 500     3 WEEKS       100 - 10,000     4 WEEKS     1,000 - 30,000     5 WEEKS     3,500 - 115,000   6-8 WEEKS     12,000 - 270,000    12 WEEKS     15,000 - 220,000        FEMALE AND NON-PREGNANT FEMALE:     LESS THAN 5 mIU/mL Performed at [facility], [address] [place], [place] [address]   Blood gas, venous (at [facility] and [facility])     Status: Abnormal   Collection Time: 11/22/23  2:43 PM  Result Value Ref Range   pH, Ven 7.07 (LL) 7.25 - 7.43    Comment: CRITICAL RESULT CALLED TO, READ BACK BY AND VERIFIED WITH: A.WAGONER, RN AT 1524 ON 11/22/23 BY N.THOMPSON    pCO2, Ven 23 (L) 44 - 60 mmHg   pO2, Ven 45 32 - 45 mmHg   Bicarbonate 6.7 (L) 20.0 - 28.0 mmol/L   Acid-base deficit 21.9 (H) 0.0 - 2.0 mmol/L   O2  Saturation 71.3 %   Patient temperature 37.0     Comment: Performed at [facility], [address], [place], [place] [address]  I-stat chem 8, ED (not at [facility], [facility] or [facility])     Status: Abnormal   Collection Time: 11/22/23  2:57 PM  Result Value Ref Range   Sodium 119 (LL) 135 - 145 mmol/L   Potassium 5.4 (H) 3.5 - 5.1 mmol/L   Chloride 93 (L) 98 - 111 mmol/L   BUN 31 (H) 6 - 20 mg/dL   Creatinine, Ser 8.49 (H) 0.44 - 1.00 mg/dL   Glucose, Bld >299 (HH) 70 - 99 mg/dL    Comment: Glucose reference range applies only to samples taken after fasting for at least 8 hours.   Calcium, Ion 1.15 1.15 - 1.40 mmol/L   TCO2 9 (L) 22 - 32 mmol/L   Hemoglobin 18.7 (H) 12.0 - 15.0 g/dL   HCT 44.9 (H) 63.9 - 53.9 %   Comment NOTIFIED PHYSICIAN   CBG monitoring, ED     Status: Abnormal   Collection Time: 11/22/23  3:56 PM  Result Value Ref Range   Glucose-Capillary >600 (HH) 70 - 99 mg/dL    Comment: Glucose reference range applies only to samples taken after fasting for at least 8 hours.  CBG monitoring, ED     Status: Abnormal   Collection Time: 11/22/23  4:33 PM  Result Value Ref Range   Glucose-Capillary >600 (HH) 70 - 99 mg/dL    Comment: Glucose reference range applies only to samples taken after fasting for at least 8 hours.    Chemistries  Recent Labs  Lab 11/22/23 1443 11/22/23 1457  NA 119* 119*  K  5.4* 5.4*  CL 78* 93*  CO2 <7*  --   GLUCOSE 906* >700*  BUN 28* 31*  CREATININE 1.71* 1.50*  CALCIUM 10.0  --   AST 15  --   ALT 34  --   ALKPHOS 144*  --   BILITOT 0.6  --    ------------------------------------------------------------------------------------------------------------------  ------------------------------------------------------------------------------------------------------------------ GFR: Estimated Creatinine Clearance: 55.6 mL/min (A) (by C-G formula based on SCr of 1.5 mg/dL (H)). Liver Function Tests: Recent Labs  Lab 11/22/23 1443   AST 15  ALT 34  ALKPHOS 144*  BILITOT 0.6  PROT 8.7*  ALBUMIN 4.9   Recent Labs  Lab 11/22/23 1443  LIPASE 45   No results for input(s): AMMONIA in the last 168 hours. Coagulation Profile: No results for input(s): INR, PROTIME in the last 168 hours. Cardiac Enzymes: No results for input(s): CKTOTAL, CKMB, CKMBINDEX, TROPONINI in the last 168 hours. BNP (last 3 results) No results for input(s): PROBNP in the last 8760 hours. HbA1C: No results for input(s): HGBA1C in the last 72 hours. CBG: Recent Labs  Lab 11/22/23 1350 11/22/23 1556 11/22/23 1633  GLUCAP >600* >600* >600*   Lipid Profile: No results for input(s): CHOL, HDL, LDLCALC, TRIG, CHOLHDL, LDLDIRECT in the last 72 hours. Thyroid Function Tests: No results for input(s): TSH, T4TOTAL, FREET4, T3FREE, THYROIDAB in the last 72 hours. Anemia Panel: No results for input(s): VITAMINB12, FOLATE, FERRITIN, TIBC, IRON, RETICCTPCT in the last 72 hours.  --------------------------------------------------------------------------------------------------------------- Urine analysis:    Component Value Date/Time   COLORURINE STRAW (A) 11/22/2023 1421   APPEARANCEUR CLEAR 11/22/2023 1421   LABSPEC 1.024 11/22/2023 1421   PHURINE 5.0 11/22/2023 1421   GLUCOSEU >=500 (A) 11/22/2023 1421   HGBUR MODERATE (A) 11/22/2023 1421   BILIRUBINUR NEGATIVE 11/22/2023 1421   KETONESUR 80 (A) 11/22/2023 1421   PROTEINUR 30 (A) 11/22/2023 1421   UROBILINOGEN 0.2 04/06/2008 0209   NITRITE NEGATIVE 11/22/2023 1421   LEUKOCYTESUR NEGATIVE 11/22/2023 1421      Imaging Results:    DG Chest Port 1 View Result Date: 11/22/2023 CLINICAL DATA:  Shortness of breath. Nausea and vomiting for 3 days. Increased urination and thirst. EXAM: PORTABLE CHEST 1 VIEW COMPARISON:  None Available. FINDINGS: 1605 hours. Two views submitted. Lordotic positioning. The heart size and mediastinal contours are  normal. The lungs are clear. There is no pleural effusion or pneumothorax. No acute osseous findings are identified. Telemetry leads overlie the chest. IMPRESSION: No evidence of active cardiopulmonary process. Electronically Signed   By: Elsie Perone M.D.   On: 11/22/2023 16:21      Assessment & Plan:    Principal Problem:   DKA (diabetic ketoacidosis) (HCC)   Diabetic ketoacidosis-patient presented with severe DKA, blood glucose under 6, CO2 less than 7, pH 7.07.  Started on IV insulin  per DKA protocol.  Will admit to stepdown unit and continue with DKA protocol.  Will keep NPO.  Check BMP every 4 hours.  No indication for bicarb at this time.  New onset diabetes mellitus-Will check hemoglobin A1c.  Previous A1c was 6.4 as of 12/24.  Will need insulin  therapy at discharge.  Pseudohyponatremia-sodium 119, in setting of significant hyperglycemia.  Hopefully sodium will improve once blood glucose improves.  Check BMP every 4 hours.  Hyperkalemia-potassium 5.4, will check potassium every 4 hours.  Acute kidney injury-BUN/creatinine elevated 28/1.71.  Likely in setting of intractable vomiting, dehydration.  Hopefully BUN/creatinine improved with IV fluids.  Continue checking BMP every 4 hours.   DVT Prophylaxis-SCDs  AM Labs Ordered, also please review Full Orders  Family Communication: Admission, patients condition and plan of care including tests being ordered have been discussed with the patient  who indicate understanding and agree with the plan and Code Status.  Code Status: Full code  Admission status: Inpatient*  Time spent in minutes : 60 min   Apolinar Bero S Madalyn Legner M.D

## 2023-11-23 ENCOUNTER — Ambulatory Visit: Admitting: Obstetrics and Gynecology

## 2023-11-23 ENCOUNTER — Encounter: Payer: Self-pay | Admitting: Obstetrics and Gynecology

## 2023-11-23 DIAGNOSIS — Z794 Long term (current) use of insulin: Secondary | ICD-10-CM | POA: Diagnosis not present

## 2023-11-23 DIAGNOSIS — E119 Type 2 diabetes mellitus without complications: Secondary | ICD-10-CM

## 2023-11-23 DIAGNOSIS — E1310 Other specified diabetes mellitus with ketoacidosis without coma: Secondary | ICD-10-CM | POA: Diagnosis not present

## 2023-11-23 DIAGNOSIS — E139 Other specified diabetes mellitus without complications: Secondary | ICD-10-CM | POA: Diagnosis not present

## 2023-11-23 DIAGNOSIS — Z862 Personal history of diseases of the blood and blood-forming organs and certain disorders involving the immune mechanism: Secondary | ICD-10-CM

## 2023-11-23 LAB — BASIC METABOLIC PANEL WITH GFR
Anion gap: 17 — ABNORMAL HIGH (ref 5–15)
Anion gap: 13 (ref 5–15)
Anion gap: 13 (ref 5–15)
Anion gap: 11 (ref 5–15)
BUN: 20 mg/dL (ref 6–20)
BUN: 19 mg/dL (ref 6–20)
BUN: 16 mg/dL (ref 6–20)
BUN: 15 mg/dL (ref 6–20)
CO2: 14 mmol/L — ABNORMAL LOW (ref 22–32)
CO2: 17 mmol/L — ABNORMAL LOW (ref 22–32)
CO2: 17 mmol/L — ABNORMAL LOW (ref 22–32)
CO2: 19 mmol/L — ABNORMAL LOW (ref 22–32)
Calcium: 9 mg/dL (ref 8.9–10.3)
Calcium: 9 mg/dL (ref 8.9–10.3)
Calcium: 9 mg/dL (ref 8.9–10.3)
Calcium: 8.8 mg/dL — ABNORMAL LOW (ref 8.9–10.3)
Chloride: 102 mmol/L (ref 98–111)
Chloride: 102 mmol/L (ref 98–111)
Chloride: 103 mmol/L (ref 98–111)
Chloride: 104 mmol/L (ref 98–111)
Creatinine, Ser: 0.98 mg/dL (ref 0.44–1.00)
Creatinine, Ser: 0.96 mg/dL (ref 0.44–1.00)
Creatinine, Ser: 0.86 mg/dL (ref 0.44–1.00)
Creatinine, Ser: 0.84 mg/dL (ref 0.44–1.00)
GFR, Estimated: >60 mL/min (ref >60)
GFR, Estimated: >60 mL/min (ref >60)
GFR, Estimated: >60 mL/min (ref >60)
GFR, Estimated: >60 mL/min (ref >60)
Glucose, Bld: 174 mg/dL — ABNORMAL HIGH (ref 70–99)
Glucose, Bld: 172 mg/dL — ABNORMAL HIGH (ref 70–99)
Glucose, Bld: 167 mg/dL — ABNORMAL HIGH (ref 70–99)
Glucose, Bld: 193 mg/dL — ABNORMAL HIGH (ref 70–99)
Potassium: 3.9 mmol/L (ref 3.5–5.1)
Potassium: 3.9 mmol/L (ref 3.5–5.1)
Potassium: 3.4 mmol/L — ABNORMAL LOW (ref 3.5–5.1)
Potassium: 3.5 mmol/L (ref 3.5–5.1)
Sodium: 133 mmol/L — ABNORMAL LOW (ref 135–145)
Sodium: 132 mmol/L — ABNORMAL LOW (ref 135–145)
Sodium: 133 mmol/L — ABNORMAL LOW (ref 135–145)
Sodium: 133 mmol/L — ABNORMAL LOW (ref 135–145)

## 2023-11-23 LAB — GLUCOSE, CAPILLARY
Glucose-Capillary: 186 mg/dL — ABNORMAL HIGH (ref 70–99)
Glucose-Capillary: 184 mg/dL — ABNORMAL HIGH (ref 70–99)
Glucose-Capillary: 183 mg/dL — ABNORMAL HIGH (ref 70–99)
Glucose-Capillary: 179 mg/dL — ABNORMAL HIGH (ref 70–99)
Glucose-Capillary: 178 mg/dL — ABNORMAL HIGH (ref 70–99)
Glucose-Capillary: 219 mg/dL — ABNORMAL HIGH (ref 70–99)
Glucose-Capillary: 134 mg/dL — ABNORMAL HIGH (ref 70–99)
Glucose-Capillary: 173 mg/dL — ABNORMAL HIGH (ref 70–99)
Glucose-Capillary: 204 mg/dL — ABNORMAL HIGH (ref 70–99)
Glucose-Capillary: 150 mg/dL — ABNORMAL HIGH (ref 70–99)
Glucose-Capillary: 139 mg/dL — ABNORMAL HIGH (ref 70–99)
Glucose-Capillary: 247 mg/dL — ABNORMAL HIGH (ref 70–99)
Glucose-Capillary: 250 mg/dL — ABNORMAL HIGH (ref 70–99)
Glucose-Capillary: 202 mg/dL — ABNORMAL HIGH (ref 70–99)
Glucose-Capillary: 112 mg/dL — ABNORMAL HIGH (ref 70–99)

## 2023-11-23 LAB — HEMOGLOBIN A1C
Hgb A1c MFr Bld: 13.7 % — ABNORMAL HIGH (ref 4.8–5.6)
Mean Plasma Glucose: 346.49 mg/dL

## 2023-11-23 LAB — BETA-HYDROXYBUTYRIC ACID
Beta-Hydroxybutyric Acid: 0.86 mmol/L — ABNORMAL HIGH (ref 0.05–0.27)
Beta-Hydroxybutyric Acid: 1.05 mmol/L — ABNORMAL HIGH (ref 0.05–0.27)
Beta-Hydroxybutyric Acid: 1.08 mmol/L — ABNORMAL HIGH (ref 0.05–0.27)
Beta-Hydroxybutyric Acid: 2.5 mmol/L — ABNORMAL HIGH (ref 0.05–0.27)
Beta-Hydroxybutyric Acid: 4.16 mmol/L — ABNORMAL HIGH (ref 0.05–0.27)
Beta-Hydroxybutyric Acid: >8 mmol/L — ABNORMAL HIGH (ref 0.05–0.27)

## 2023-11-23 LAB — HIV ANTIBODY (ROUTINE TESTING W REFLEX): HIV Screen 4th Generation wRfx: NONREACTIVE

## 2023-11-23 MED ORDER — MENTHOL 3 MG MT LOZG
1.0000 | LOZENGE | OROMUCOSAL | Status: DC | PRN
  Administered 2023-11-24: 3 mg via ORAL
  Filled 2023-11-23 (×2): qty 9

## 2023-11-23 MED ORDER — INSULIN ASPART 100 UNIT/ML IJ SOLN
0.0000 [IU] | Freq: Three times a day (TID) | INTRAMUSCULAR | Status: DC
  Administered 2023-11-23: 5 [IU] via SUBCUTANEOUS
  Administered 2023-11-24: 8 [IU] via SUBCUTANEOUS

## 2023-11-23 MED ORDER — LIVING WELL WITH DIABETES BOOK
Freq: Once | Status: AC
  Filled 2023-11-23: qty 1

## 2023-11-23 MED ORDER — INSULIN GLARGINE 100 UNIT/ML ~~LOC~~ SOLN
20.0000 [IU] | Freq: Every day | SUBCUTANEOUS | Status: DC
  Administered 2023-11-23 – 2023-11-24 (×2): 20 [IU] via SUBCUTANEOUS
  Filled 2023-11-23 (×2): qty 0.2

## 2023-11-23 MED ORDER — ACETAMINOPHEN 325 MG PO TABS
650.0000 mg | ORAL_TABLET | Freq: Four times a day (QID) | ORAL | Status: DC | PRN
  Administered 2023-11-23: 650 mg via ORAL
  Filled 2023-11-23: qty 2

## 2023-11-23 MED ORDER — INSULIN ASPART 100 UNIT/ML IJ SOLN
6.0000 [IU] | Freq: Three times a day (TID) | INTRAMUSCULAR | Status: DC
  Administered 2023-11-23 – 2023-11-24 (×3): 6 [IU] via SUBCUTANEOUS

## 2023-11-23 MED ORDER — INSULIN STARTER KIT- PEN NEEDLES (ENGLISH)
1.0000 | Freq: Once | Status: AC
  Administered 2023-11-23: 1
  Filled 2023-11-23: qty 1

## 2023-11-23 MED ORDER — POTASSIUM CHLORIDE CRYS ER 20 MEQ PO TBCR
40.0000 meq | EXTENDED_RELEASE_TABLET | Freq: Once | ORAL | Status: AC
  Administered 2023-11-23: 40 meq via ORAL
  Filled 2023-11-23: qty 2

## 2023-11-23 NOTE — Inpatient Diabetes Management (Signed)
 Inpatient Diabetes Program Recommendations  AACE/ADA: New Consensus Statement on Inpatient Glycemic Control (2015)  Target Ranges:  Prepandial:   less than 140 mg/dL      Peak postprandial:   less than 180 mg/dL (1-2 hours)      Critically ill patients:  140 - 180 mg/dL   Lab Results  Component Value Date   GLUCAP 247 (H) 11/23/2023   HGBA1C 13.7 (H) 11/22/2023    Review of Glycemic Control  Diabetes history: New-onset DM Outpatient Diabetes medications: None Current orders for Inpatient glycemic control: Lantus  20 daily, Novolog  0-15 TID with meals and 0-5 HS + 6 units TID  HgbA1C - 13.7% Transitioned off drip to Lantus  20 units.  Inpatient Diabetes Program Recommendations:   Agree with orders.  Spoke with patient about new diabetes diagnosis.  Discussed A1C results (13.7%) and explained what an A1C is and informed patient that his current A1C indicates an average glucose of 346 mg/dl over the past 2-3 months. Discussed basic pathophysiology of DM Type 2, basic home care, importance of checking CBGs and maintaining good CBG control to prevent long-term and short-term complications. Reviewed glucose and A1C goals. Reviewed signs and symptoms of hyperglycemia and hypoglycemia along with treatment for both. Discussed impact of nutrition, exercise, stress, sickness, and medications on diabetes control. Reviewed Living Well with diabetes booklet and encouraged patient to read through entire book. Educated on The Plate Method, CHO's, portion control, avoiding caloric beverages, CBGs at least 3-4x/day and F/U with PCP every 3 months, bring meter to PCP office. Discussed importance of exercise in diabetes management. Educated patient on insulin  pen use at home. Reviewed contents of insulin  flexpen starter kit. Reviewed all steps of insulin  pen including attachment of needle, 2-unit air shot, dialing up dose, giving injection, removing needle, disposal of sharps, storage of unused insulin ,  disposal of insulin  etc. Patient able to provide successful return demonstration. Also reviewed troubleshooting with insulin  pen. MD to give patient Rxs for insulin  pens and insulin  pen needles. We discussed CGMs and pt will try to download [place] 3 plus on phone. If MD agrees, can give samples to try at home. Patient verbalized understanding of information discussed and he states that he has no further questions at this time related to diabetes.   RNs to provide ongoing basic DM education at bedside with this patient and engage patient to actively check blood glucose and administer insulin  injections.   F/U in am.  Thank you. Shona Brandy, RD, LDN, CDCES Inpatient Diabetes Coordinator [phone]

## 2023-11-23 NOTE — Assessment & Plan Note (Signed)
-   Etiology likely in setting of her uterine fibroids and AUB -Last hemoglobin in December normal, 13.7 g/dL -Likely is hemoconcentrated on admission -Continue trending - Hgb appropriately diluted after adequate fluid resuscitation from admission.  Hemoglobin 11.4 g/dL at discharge.  No signs of bleeding

## 2023-11-23 NOTE — Plan of Care (Signed)
 Nutrition Education Note  RD consulted for nutrition education regarding new diagnosis of diabetes.   Lab Results  Component Value Date   HGBA1C 13.7 (H) 11/22/2023   Patient in bed at time of visit, boyfriend at bedside.   Patient reports she usually just snacks throughout the day on fruits and vegetables. Boyfriend shares the patient has been eating a lot of candy recently. She drinks water and organic fruit juice at home.  RD provided Carbohydrate Counting for People with Diabetes, Using Nutrition Labels: Carbohydrate, and Plate Method for Diabetes handouts from the Academy of Nutrition and Dietetics. Discussed different food groups and their effects on blood sugar, emphasizing carbohydrate-containing foods. Provided list of carbohydrates and recommended serving sizes of common foods.  Discussed importance of controlled and consistent carbohydrate intake throughout the day. Provided examples of ways to balance meals/snacks and encouraged intake of high-fiber, whole grain complex carbohydrates. Teach back method used.   Encouraged patient to try and cut out candy from the diet and consume mostly water for drinking.  Expect good compliance.  Current diet order is Carb Modified, no meal intakes documented at this time. Labs and medications reviewed. No further nutrition interventions warranted at this time. If additional nutrition issues arise, please re-consult RD.  Outpatient RD referral added.  Trude Ned RD, LDN Contact via [employer].

## 2023-11-23 NOTE — Plan of Care (Signed)
  Problem: Education: Goal: Knowledge of General Education information will improve Description: Including pain rating scale, medication(s)/side effects and non-pharmacologic comfort measures Outcome: Progressing   Problem: Health Behavior/Discharge Planning: Goal: Ability to manage health-related needs will improve Outcome: Progressing   Problem: Clinical Measurements: Goal: Will remain free from infection Outcome: Progressing Goal: Respiratory complications will improve Outcome: Progressing   Problem: Safety: Goal: Ability to remain free from injury will improve Outcome: Progressing   

## 2023-11-23 NOTE — Assessment & Plan Note (Signed)
-   Family history noted with mom, dad, both siblings (brother and sister).  Her siblings are older around late 67s and early 28s at diagnosis -Patient 33 years old and given family history main etiologies would possibly be DMII vs MODY - checking anti-islet Ab and GAD-65 Ab - A1c 13.7% - Will need insulin  at discharge - Diabetes coordinator and RD consulted - insulin  started; will modify further as necessary

## 2023-11-23 NOTE — Progress Notes (Signed)
 Progress Note    Joanne Schneider   FMW:[id number]  DOB: 11-25-1990  DOA: 11/22/2023     1 PCP: Joanne Schneider  Initial CC: N/V  Hospital Course: Joanne Schneider is a 33 yo female with PMH prediabetes, uterine fibroid, IDA who presented with nausea, vomiting. Workup was notable for DKA.  Glucose was 906 on BMP on admission, CO2 undetectable, anion gap unable to be calculated, beta hydroxy butyric acid greater than 8. She was started on fluids and insulin  drip.  GI symptoms slowly improved as DKA resolved.  Her A1c returned at 13.7% which was previously 6.4% on 02/25/2023. She does not follow any restrictive diabetic diet.  Interval History:  Feeling better this morning and Schneider further nausea or vomiting.  She is wanting to advance to a diet.  Reviewed her A1c with her.  Explained she will need insulin  at discharge which she understands.  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  Her siblings are older around late 46s and early 45s at diagnosis -Patient 33 years old and given family history main etiologies would possibly be DMII vs MODY - checking anti-islet Ab and GAD-65 Ab - A1c 13.7% - Will need insulin  at discharge - Diabetes coordinator and RD consulted - insulin  started; will modify further as necessary  History of iron deficiency anemia - Etiology likely in setting of her uterine fibroids and AUB -Last hemoglobin in December normal, 13.7 g/dL -Likely is hemoconcentrated on admission -Continue trending  Fibroid - s/p surgery in August for removal   Old records reviewed in assessment of this patient  Antimicrobials:   DVT prophylaxis:  SCDs Start: 11/22/23  1823   Code Status:   Code Status: Full Code  Mobility Assessment (Last 72 Hours)     Mobility Assessment     Row Name 11/23/23 0908 11/22/23 2200 11/22/23 1830       Does the patient have exclusion criteria? Yes- Hold (Level 0) - Assessment complete  bedrest Schneider - Perform mobility assessment Schneider - Perform mobility assessment     What is the highest level of mobility based on the mobility assessment? -- Level 1 (Bedfast) - Unable to balance while sitting on edge of bed Level 1 (Bedfast) - Unable to balance while sitting on edge of bed     Is the above level different from baseline mobility prior to current illness? -- Yes - Recommend PT order --        Barriers to discharge:  Disposition Plan:  Home HH orders placed: n/a Status is: Inpt  Objective: Blood pressure 116/85, pulse 88, temperature 98.5 F (36.9 C), temperature source Oral, resp. rate 15, height 5' 5 (1.651 m), weight 74.1 kg, SpO2 100%.  Examination:  Physical Exam Constitutional:      Appearance: Normal appearance.  HENT:     Head: Normocephalic and atraumatic.     Mouth/Throat:     Mouth: Mucous membranes are moist.  Eyes:     Extraocular Movements: Extraocular movements intact.  Cardiovascular:     Rate and Rhythm: Normal rate and regular rhythm.  Pulmonary:     Effort: Pulmonary effort is normal. Schneider respiratory distress.     Breath sounds:  Normal breath sounds. Schneider wheezing.  Abdominal:     General: Bowel sounds are normal. There is Schneider distension.     Palpations: Abdomen is soft.     Tenderness: There is Schneider abdominal tenderness.     Comments: Mild generalized tenderness noted.  Healing abdominal laparoscopic incisions noted  Musculoskeletal:        General: Normal range of motion.     Cervical back: Normal range of motion and neck supple.  Skin:    General: Skin is warm and dry.  Neurological:     General: Schneider focal deficit present.     Mental Status: She is alert.  Psychiatric:        Mood and  Affect: Mood normal.      Consultants:    Procedures:    Data Reviewed: Results for orders placed or performed during the hospital encounter of 11/22/23 (from the past 24 hours)  CBG monitoring, ED     Status: Abnormal   Collection Time: 11/22/23  1:50 PM  Result Value Ref Range   Glucose-Capillary >600 (HH) 70 - 99 mg/dL  Urinalysis, Routine w reflex microscopic -Urine, Clean Catch     Status: Abnormal   Collection Time: 11/22/23  2:21 PM  Result Value Ref Range   Color, Urine STRAW (A) YELLOW   APPearance CLEAR CLEAR   Specific Gravity, Urine 1.024 1.005 - 1.030   pH 5.0 5.0 - 8.0   Glucose, UA >=500 (A) NEGATIVE mg/dL   Hgb urine dipstick MODERATE (A) NEGATIVE   Bilirubin Urine NEGATIVE NEGATIVE   Ketones, ur 80 (A) NEGATIVE mg/dL   Protein, ur 30 (A) NEGATIVE mg/dL   Nitrite NEGATIVE NEGATIVE   Leukocytes,Ua NEGATIVE NEGATIVE   RBC / HPF 0-5 0 - 5 RBC/hpf   WBC, UA 0-5 0 - 5 WBC/hpf   Bacteria, UA NONE SEEN NONE SEEN   Squamous Epithelial / HPF 0-5 0 - 5 /HPF   Hyaline Casts, UA PRESENT   CBC with Differential     Status: Abnormal   Collection Time: 11/22/23  2:43 PM  Result Value Ref Range   WBC 13.5 (H) 4.0 - 10.5 K/uL   RBC 5.66 (H) 3.87 - 5.11 MIL/uL   Hemoglobin 15.6 (H) 12.0 - 15.0 g/dL   HCT 50.7 (H) 63.9 - 53.9 %   MCV 86.9 80.0 - 100.0 fL   MCH 27.6 26.0 - 34.0 pg   MCHC 31.7 30.0 - 36.0 g/dL   RDW 86.4 88.4 - 84.4 %   Platelets 305 150 - 400 K/uL   nRBC 0.0 0.0 - 0.2 %   Neutrophils Relative % 80 %   Neutro Abs 10.9 (H) 1.7 - 7.7 K/uL   Lymphocytes Relative 12 %   Lymphs Abs 1.6 0.7 - 4.0 K/uL   Monocytes Relative 7 %   Monocytes Absolute 0.9 0.1 - 1.0 K/uL   Eosinophils Relative 0 %   Eosinophils Absolute 0.0 0.0 - 0.5 K/uL   Basophils Relative 0 %   Basophils Absolute 0.0 0.0 - 0.1 K/uL   Immature Granulocytes 1 %   Abs Immature Granulocytes 0.08 (H) 0.00 - 0.07 K/uL  Comprehensive metabolic panel     Status: Abnormal   Collection Time:  11/22/23  2:43 PM  Result Value Ref Range   Sodium 119 (LL) 135 - 145 mmol/L   Potassium 5.4 (H) 3.5 - 5.1 mmol/L   Chloride 78 (L) 98 - 111 mmol/L   CO2 <7 (L) 22 - 32 mmol/L  Glucose, Bld 906 (HH) 70 - 99 mg/dL   BUN 28 (H) 6 - 20 mg/dL   Creatinine, Ser 8.28 (H) 0.44 - 1.00 mg/dL   Calcium 89.9 8.9 - 89.6 mg/dL   Total Protein 8.7 (H) 6.5 - 8.1 g/dL   Albumin 4.9 3.5 - 5.0 g/dL   AST 15 15 - 41 U/L   ALT 34 0 - 44 U/L   Alkaline Phosphatase 144 (H) 38 - 126 U/L   Total Bilirubin 0.6 0.0 - 1.2 mg/dL   GFR, Estimated 40 (L) >60 mL/min   Anion gap NOT CALCULATED 5 - 15  Lipase, blood     Status: None   Collection Time: 11/22/23  2:43 PM  Result Value Ref Range   Lipase 45 11 - 51 U/L  hCG, quantitative, pregnancy     Status: None   Collection Time: 11/22/23  2:43 PM  Result Value Ref Range   hCG, Beta Chain, Quant, S <1 <5 mIU/mL  Blood gas, venous (at [facility] and [facility])     Status: Abnormal   Collection Time: 11/22/23  2:43 PM  Result Value Ref Range   pH, Ven 7.07 (LL) 7.25 - 7.43   pCO2, Ven 23 (L) 44 - 60 mmHg   pO2, Ven 45 32 - 45 mmHg   Bicarbonate 6.7 (L) 20.0 - 28.0 mmol/L   Acid-base deficit 21.9 (H) 0.0 - 2.0 mmol/L   O2 Saturation 71.3 %   Patient temperature 37.0   Beta-hydroxybutyric acid     Status: Abnormal   Collection Time: 11/22/23  2:43 PM  Result Value Ref Range   Beta-Hydroxybutyric Acid >8.00 (H) 0.05 - 0.27 mmol/L  I-stat chem 8, ED (not at [facility], [facility] or [facility])     Status: Abnormal   Collection Time: 11/22/23  2:57 PM  Result Value Ref Range   Sodium 119 (LL) 135 - 145 mmol/L   Potassium 5.4 (H) 3.5 - 5.1 mmol/L   Chloride 93 (L) 98 - 111 mmol/L   BUN 31 (H) 6 - 20 mg/dL   Creatinine, Ser 8.49 (H) 0.44 - 1.00 mg/dL   Glucose, Bld >299 (HH) 70 - 99 mg/dL   Calcium, Ion 8.84 8.84 - 1.40 mmol/L   TCO2 9 (L) 22 - 32 mmol/L   Hemoglobin 18.7 (H) 12.0 - 15.0 g/dL   HCT 44.9 (H) 63.9 - 53.9 %   Comment NOTIFIED PHYSICIAN   CBG monitoring, ED     Status:  Abnormal   Collection Time: 11/22/23  3:56 PM  Result Value Ref Range   Glucose-Capillary >600 (HH) 70 - 99 mg/dL  hCG, serum, qualitative     Status: None   Collection Time: 11/22/23  4:20 PM  Result Value Ref Range   Preg, Serum NEGATIVE NEGATIVE  CBG monitoring, ED     Status: Abnormal   Collection Time: 11/22/23  4:33 PM  Result Value Ref Range   Glucose-Capillary >600 (HH) 70 - 99 mg/dL  CBG monitoring, ED     Status: Abnormal   Collection Time: 11/22/23  5:46 PM  Result Value Ref Range   Glucose-Capillary 342 (H) 70 - 99 mg/dL  Glucose, capillary     Status: Abnormal   Collection Time: 11/22/23  6:17 PM  Result Value Ref Range   Glucose-Capillary 341 (H) 70 - 99 mg/dL   Comment 1 Notify RN   MRSA Next Gen by PCR, Nasal     Status: None   Collection Time: 11/22/23  6:56 PM  Specimen: Nasal Mucosa; Nasal Swab  Result Value Ref Range   MRSA by PCR Next Gen NOT DETECTED NOT DETECTED  Glucose, capillary     Status: Abnormal   Collection Time: 11/22/23  7:33 PM  Result Value Ref Range   Glucose-Capillary 159 (H) 70 - 99 mg/dL  Glucose, capillary     Status: Abnormal   Collection Time: 11/22/23  8:39 PM  Result Value Ref Range   Glucose-Capillary 125 (H) 70 - 99 mg/dL  Basic metabolic panel     Status: Abnormal   Collection Time: 11/22/23  8:44 PM  Result Value Ref Range   Sodium 134 (L) 135 - 145 mmol/L   Potassium 4.3 3.5 - 5.1 mmol/L   Chloride 100 98 - 111 mmol/L   CO2 12 (L) 22 - 32 mmol/L   Glucose, Bld 149 (H) 70 - 99 mg/dL   BUN 21 (H) 6 - 20 mg/dL   Creatinine, Ser 8.93 (H) 0.44 - 1.00 mg/dL   Calcium 9.3 8.9 - 89.6 mg/dL   GFR, Estimated >39 >39 mL/min   Anion gap 22 (H) 5 - 15  Beta-hydroxybutyric acid     Status: Abnormal   Collection Time: 11/22/23  8:44 PM  Result Value Ref Range   Beta-Hydroxybutyric Acid 4.16 (H) 0.05 - 0.27 mmol/L  Magnesium     Status: Abnormal   Collection Time: 11/22/23  8:44 PM  Result Value Ref Range   Magnesium 2.5 (H) 1.7  - 2.4 mg/dL  Glucose, capillary     Status: Abnormal   Collection Time: 11/22/23  9:56 PM  Result Value Ref Range   Glucose-Capillary 177 (H) 70 - 99 mg/dL  Glucose, capillary     Status: Abnormal   Collection Time: 11/22/23 11:01 PM  Result Value Ref Range   Glucose-Capillary 231 (H) 70 - 99 mg/dL  Basic metabolic panel     Status: Abnormal   Collection Time: 11/22/23 11:55 PM  Result Value Ref Range   Sodium 133 (L) 135 - 145 mmol/L   Potassium 3.9 3.5 - 5.1 mmol/L   Chloride 102 98 - 111 mmol/L   CO2 14 (L) 22 - 32 mmol/L   Glucose, Bld 174 (H) 70 - 99 mg/dL   BUN 20 6 - 20 mg/dL   Creatinine, Ser 9.01 0.44 - 1.00 mg/dL   Calcium 9.0 8.9 - 89.6 mg/dL   GFR, Estimated >39 >39 mL/min   Anion gap 17 (H) 5 - 15  Beta-hydroxybutyric acid     Status: Abnormal   Collection Time: 11/22/23 11:55 PM  Result Value Ref Range   Beta-Hydroxybutyric Acid 2.50 (H) 0.05 - 0.27 mmol/L  Hemoglobin A1c     Status: Abnormal   Collection Time: 11/22/23 11:55 PM  Result Value Ref Range   Hgb A1c MFr Bld 13.7 (H) 4.8 - 5.6 %   Mean Plasma Glucose 346.49 mg/dL  Glucose, capillary     Status: Abnormal   Collection Time: 11/22/23 11:56 PM  Result Value Ref Range   Glucose-Capillary 172 (H) 70 - 99 mg/dL  Glucose, capillary     Status: Abnormal   Collection Time: 11/23/23  1:32 AM  Result Value Ref Range   Glucose-Capillary 186 (H) 70 - 99 mg/dL  Glucose, capillary     Status: Abnormal   Collection Time: 11/23/23  2:37 AM  Result Value Ref Range   Glucose-Capillary 184 (H) 70 - 99 mg/dL  Basic metabolic panel     Status: Abnormal   Collection  Time: 11/23/23  3:02 AM  Result Value Ref Range   Sodium 132 (L) 135 - 145 mmol/L   Potassium 3.9 3.5 - 5.1 mmol/L   Chloride 102 98 - 111 mmol/L   CO2 17 (L) 22 - 32 mmol/L   Glucose, Bld 172 (H) 70 - 99 mg/dL   BUN 19 6 - 20 mg/dL   Creatinine, Ser 9.03 0.44 - 1.00 mg/dL   Calcium 9.0 8.9 - 89.6 mg/dL   GFR, Estimated >39 >39 mL/min   Anion gap  13 5 - 15  Beta-hydroxybutyric acid     Status: Abnormal   Collection Time: 11/23/23  3:02 AM  Result Value Ref Range   Beta-Hydroxybutyric Acid 1.05 (H) 0.05 - 0.27 mmol/L  Glucose, capillary     Status: Abnormal   Collection Time: 11/23/23  3:48 AM  Result Value Ref Range   Glucose-Capillary 183 (H) 70 - 99 mg/dL  Glucose, capillary     Status: Abnormal   Collection Time: 11/23/23  5:05 AM  Result Value Ref Range   Glucose-Capillary 179 (H) 70 - 99 mg/dL  Glucose, capillary     Status: Abnormal   Collection Time: 11/23/23  6:06 AM  Result Value Ref Range   Glucose-Capillary 178 (H) 70 - 99 mg/dL   Comment 1 Notify RN    Comment 2 Document in Chart   Basic metabolic panel     Status: Abnormal   Collection Time: 11/23/23  6:17 AM  Result Value Ref Range   Sodium 133 (L) 135 - 145 mmol/L   Potassium 3.4 (L) 3.5 - 5.1 mmol/L   Chloride 103 98 - 111 mmol/L   CO2 17 (L) 22 - 32 mmol/L   Glucose, Bld 167 (H) 70 - 99 mg/dL   BUN 16 6 - 20 mg/dL   Creatinine, Ser 9.13 0.44 - 1.00 mg/dL   Calcium 9.0 8.9 - 89.6 mg/dL   GFR, Estimated >39 >39 mL/min   Anion gap 13 5 - 15  Beta-hydroxybutyric acid     Status: Abnormal   Collection Time: 11/23/23  6:17 AM  Result Value Ref Range   Beta-Hydroxybutyric Acid 1.08 (H) 0.05 - 0.27 mmol/L  HIV Antibody (routine testing w rflx)     Status: None   Collection Time: 11/23/23  6:17 AM  Result Value Ref Range   HIV Screen 4th Generation wRfx Non Reactive Non Reactive  Glucose, capillary     Status: Abnormal   Collection Time: 11/23/23  7:15 AM  Result Value Ref Range   Glucose-Capillary 219 (H) 70 - 99 mg/dL  Glucose, capillary     Status: Abnormal   Collection Time: 11/23/23  8:17 AM  Result Value Ref Range   Glucose-Capillary 134 (H) 70 - 99 mg/dL   Comment 1 Notify RN    Comment 2 Document in Chart    Comment 3 Glucose Stabilizer   Glucose, capillary     Status: Abnormal   Collection Time: 11/23/23  9:17 AM  Result Value Ref Range    Glucose-Capillary 173 (H) 70 - 99 mg/dL   Comment 1 Notify RN    Comment 2 Document in Chart    Comment 3 Glucose Stabilizer   Glucose, capillary     Status: Abnormal   Collection Time: 11/23/23 10:26 AM  Result Value Ref Range   Glucose-Capillary 204 (H) 70 - 99 mg/dL  Basic metabolic panel     Status: Abnormal   Collection Time: 11/23/23 10:52 AM  Result Value  Ref Range   Sodium 133 (L) 135 - 145 mmol/L   Potassium 3.5 3.5 - 5.1 mmol/L   Chloride 104 98 - 111 mmol/L   CO2 19 (L) 22 - 32 mmol/L   Glucose, Bld 193 (H) 70 - 99 mg/dL   BUN 15 6 - 20 mg/dL   Creatinine, Ser 9.15 0.44 - 1.00 mg/dL   Calcium 8.8 (L) 8.9 - 10.3 mg/dL   GFR, Estimated >39 >39 mL/min   Anion gap 11 5 - 15  Glucose, capillary     Status: Abnormal   Collection Time: 11/23/23 11:26 AM  Result Value Ref Range   Glucose-Capillary 150 (H) 70 - 99 mg/dL  Glucose, capillary     Status: Abnormal   Collection Time: 11/23/23 12:24 PM  Result Value Ref Range   Glucose-Capillary 139 (H) 70 - 99 mg/dL   Comment 1 Notify RN    Comment 2 Document in Chart    Comment 3 Glucose Stabilizer     I have reviewed pertinent nursing notes, vitals, labs, and images as necessary. I have ordered labwork to follow up on as indicated.  I have reviewed the last notes from staff over past 24 hours. I have discussed patient's care plan and test results with nursing staff, CM/SW, and other staff as appropriate.  Time spent: Greater than 50% of the 55 minute visit was spent in counseling/coordination of care for the patient as laid out in the A&P.   LOS: 1 day   Alm Apo, MD [facility] 11/23/2023, 1:12 PM

## 2023-11-23 NOTE — Hospital Course (Signed)
 Ms. Brazill is a 33 yo female with PMH prediabetes, uterine fibroid, IDA who presented with nausea, vomiting. Workup was notable for DKA.  Glucose was 906 on BMP on admission, CO2 undetectable, anion gap unable to be calculated, beta hydroxy butyric acid greater than 8. She was started on fluids and insulin  drip.  GI symptoms slowly improved as DKA resolved.  Her A1c returned at 13.7% which was previously 6.4% on 02/25/2023. She does not follow any restrictive diabetic diet.

## 2023-11-23 NOTE — Assessment & Plan Note (Signed)
-   s/p surgery in August for removal

## 2023-11-23 NOTE — Plan of Care (Signed)
  Problem: Education: Goal: Knowledge of General Education information will improve Description: Including pain rating scale, medication(s)/side effects and non-pharmacologic comfort measures Outcome: Progressing   Problem: Health Behavior/Discharge Planning: Goal: Ability to manage health-related needs will improve Outcome: Progressing   Problem: Clinical Measurements: Goal: Ability to maintain clinical measurements within normal limits will improve Outcome: Progressing Goal: Will remain free from infection Outcome: Progressing Goal: Diagnostic test results will improve Outcome: Progressing Goal: Respiratory complications will improve Outcome: Progressing Goal: Cardiovascular complication will be avoided Outcome: Progressing   Problem: Activity: Goal: Risk for activity intolerance will decrease Outcome: Progressing   Problem: Nutrition: Goal: Adequate nutrition will be maintained Outcome: Progressing   Problem: Coping: Goal: Level of anxiety will decrease Outcome: Progressing   Problem: Elimination: Goal: Will not experience complications related to bowel motility Outcome: Progressing Goal: Will not experience complications related to urinary retention Outcome: Progressing   Problem: Pain Managment: Goal: General experience of comfort will improve and/or be controlled Outcome: Progressing   Problem: Safety: Goal: Ability to remain free from injury will improve Outcome: Progressing   Problem: Skin Integrity: Goal: Risk for impaired skin integrity will decrease Outcome: Progressing   Problem: Education: Goal: Ability to describe self-care measures that may prevent or decrease complications (Diabetes Survival Skills Education) will improve Outcome: Progressing Goal: Individualized Educational Video(s) Outcome: Progressing   Problem: Coping: Goal: Ability to adjust to condition or change in health will improve Outcome: Progressing

## 2023-11-23 NOTE — Discharge Instructions (Signed)
 FOOD PANTRY [employer] [facility] [address] [phone]  [facility] [address] [place] [phone]  [facility] [employer] [address] [place] [phone]  [facility] [address] [phone]  [facility] [address] [place], [place] [address] [phone]

## 2023-11-23 NOTE — Assessment & Plan Note (Signed)
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

## 2023-11-24 ENCOUNTER — Other Ambulatory Visit (HOSPITAL_COMMUNITY): Payer: Self-pay

## 2023-11-24 ENCOUNTER — Telehealth: Payer: Self-pay

## 2023-11-24 ENCOUNTER — Telehealth (HOSPITAL_COMMUNITY): Payer: Self-pay | Admitting: Pharmacy Technician

## 2023-11-24 DIAGNOSIS — Z794 Long term (current) use of insulin: Secondary | ICD-10-CM | POA: Diagnosis not present

## 2023-11-24 DIAGNOSIS — E139 Other specified diabetes mellitus without complications: Secondary | ICD-10-CM | POA: Diagnosis not present

## 2023-11-24 DIAGNOSIS — E1310 Other specified diabetes mellitus with ketoacidosis without coma: Secondary | ICD-10-CM | POA: Diagnosis not present

## 2023-11-24 LAB — CBC WITH DIFFERENTIAL/PLATELET
Abs Immature Granulocytes: 0.01 K/uL (ref 0.00–0.07)
Basophils Absolute: 0 K/uL (ref 0.0–0.1)
Basophils Relative: 0 %
Eosinophils Absolute: 0 K/uL (ref 0.0–0.5)
Eosinophils Relative: 0 %
HCT: 35.7 % — ABNORMAL LOW (ref 36.0–46.0)
Hemoglobin: 11.4 g/dL — ABNORMAL LOW (ref 12.0–15.0)
Immature Granulocytes: 0 %
Lymphocytes Relative: 57 %
Lymphs Abs: 3 K/uL (ref 0.7–4.0)
MCH: 26.8 pg (ref 26.0–34.0)
MCHC: 31.9 g/dL (ref 30.0–36.0)
MCV: 84 fL (ref 80.0–100.0)
Monocytes Absolute: 0.4 K/uL (ref 0.1–1.0)
Monocytes Relative: 8 %
Neutro Abs: 1.8 K/uL (ref 1.7–7.7)
Neutrophils Relative %: 35 %
Platelets: 163 K/uL (ref 150–400)
RBC: 4.25 MIL/uL (ref 3.87–5.11)
RDW: 14 % (ref 11.5–15.5)
WBC: 5.3 K/uL (ref 4.0–10.5)
nRBC: 0 % (ref 0.0–0.2)

## 2023-11-24 LAB — BASIC METABOLIC PANEL WITH GFR
Anion gap: 11 (ref 5–15)
BUN: 8 mg/dL (ref 6–20)
CO2: 21 mmol/L — ABNORMAL LOW (ref 22–32)
Calcium: 8.7 mg/dL — ABNORMAL LOW (ref 8.9–10.3)
Chloride: 103 mmol/L (ref 98–111)
Creatinine, Ser: 0.67 mg/dL (ref 0.44–1.00)
GFR, Estimated: >60 mL/min (ref >60)
Glucose, Bld: 288 mg/dL — ABNORMAL HIGH (ref 70–99)
Potassium: 3.6 mmol/L (ref 3.5–5.1)
Sodium: 135 mmol/L (ref 135–145)

## 2023-11-24 LAB — GLUCOSE, CAPILLARY
Glucose-Capillary: 269 mg/dL — ABNORMAL HIGH (ref 70–99)
Glucose-Capillary: 105 mg/dL — ABNORMAL HIGH (ref 70–99)

## 2023-11-24 LAB — ANTI-ISLET CELL ANTIBODY: Pancreatic Islet Cell Antibody: NEGATIVE

## 2023-11-24 LAB — MAGNESIUM: Magnesium: 2.4 mg/dL (ref 1.7–2.4)

## 2023-11-24 MED ORDER — INSULIN ASPART 100 UNIT/ML FLEXPEN
6.0000 [IU] | PEN_INJECTOR | Freq: Three times a day (TID) | SUBCUTANEOUS | 3 refills | Status: AC
  Filled 2023-11-24: qty 15, 34d supply, fill #0

## 2023-11-24 MED ORDER — BLOOD GLUCOSE TEST VI STRP
1.0000 | ORAL_STRIP | Freq: Three times a day (TID) | 3 refills | Status: AC
  Filled 2023-11-24: qty 200, 50d supply, fill #0

## 2023-11-24 MED ORDER — PEN NEEDLES 31G X 5 MM MISC
1.0000 | Freq: Three times a day (TID) | 3 refills | Status: AC
  Filled 2023-11-24: qty 100, 25d supply, fill #0

## 2023-11-24 MED ORDER — LANCETS MISC
1.0000 | Freq: Three times a day (TID) | 3 refills | Status: AC
  Filled 2023-11-24: qty 200, 50d supply, fill #0

## 2023-11-24 MED ORDER — BLOOD GLUCOSE METER KIT
1.0000 | PACK | Freq: Three times a day (TID) | 0 refills | Status: AC
  Filled 2023-11-24: qty 1, 30d supply, fill #0

## 2023-11-24 MED ORDER — LANCET DEVICE MISC
1.0000 | Freq: Three times a day (TID) | 0 refills | Status: AC
  Filled 2023-11-24: qty 1, fill #0

## 2023-11-24 MED ORDER — INSULIN GLARGINE 100 UNIT/ML SOLOSTAR PEN
20.0000 [IU] | PEN_INJECTOR | Freq: Every day | SUBCUTANEOUS | 3 refills | Status: AC
  Filled 2023-11-24: qty 15, 75d supply, fill #0

## 2023-11-24 NOTE — Discharge Summary (Signed)
 Physician Discharge Summary   Joanne Schneider FMW:[id number] DOB: 1990-08-09 DOA: 11/22/2023  PCP: Freddrick, No  Admit date: 11/22/2023 Discharge date: 11/24/2023  Admitted From: Home Disposition: Home Discharging physician: Alm Apo, MD Barriers to discharge: None  Recommendations at discharge: Adjust insulin  regimen further as necessary Follow-up antibodies pending at discharge   Discharge Condition: stable CODE STATUS: Full Diet recommendation:  Diet Orders (From admission, onward)     Start     Ordered   11/24/23 0000  Diet Carb Modified        11/24/23 0900   11/23/23 0839  Diet Carb Modified Fluid consistency: Thin; Room service appropriate? Yes  Diet effective now       Question Answer Comment  Diet-HS Snack? Nothing   Calorie Level Medium 1600-2000   Fluid consistency: Thin   Room service appropriate? Yes      11/23/23 0839            Hospital Course: Joanne Schneider is a 33 yo female with PMH prediabetes, uterine fibroid, IDA who presented with nausea, vomiting. Workup was notable for DKA.  Glucose was 906 on BMP on admission, CO2 undetectable, anion gap unable to be calculated, beta hydroxy butyric acid greater than 8. She was started on fluids and insulin  drip.  GI symptoms slowly improved as DKA resolved.  Her A1c returned at 13.7% which was previously 6.4% on 02/25/2023. She does not follow any restrictive diabetic diet.  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  Her siblings are older around late 67s and early 46s at diagnosis -Patient 33 years old and given family history main etiologies would possibly be DMII vs MODY - checking anti-islet Ab and GAD-65 Ab - A1c  13.7% - Will need insulin  at discharge - Diabetes coordinator and RD consulted - Further insulin  adjustment likely necessary at outpatient follow-up  History of iron deficiency anemia - Etiology likely in setting of her uterine fibroids and AUB -Last hemoglobin in December normal, 13.7 g/dL -Likely is hemoconcentrated on admission -Continue trending - Hgb appropriately diluted after adequate fluid resuscitation from admission.  Hemoglobin 11.4 g/dL at discharge.  No signs of bleeding  Fibroid - s/p surgery in August for removal   The patient's acute and chronic medical conditions were treated accordingly. On day of discharge, patient was felt deemed stable for discharge. Patient/family member advised to call PCP or come back to ER if needed.   Principal Diagnosis: DKA (diabetic ketoacidosis) (HCC)  Discharge Diagnoses: Active Hospital Problems   Diagnosis Date Noted   Diabetes mellitus (HCC) 11/23/2023    Priority: 2.   History of iron deficiency anemia 11/23/2023   Fibroid 10/19/2023    Resolved Hospital Problems   Diagnosis Date Noted Date Resolved   DKA (diabetic ketoacidosis) (HCC) 11/22/2023 11/23/2023    Priority: 1.     Discharge Instructions     Amb Referral to Nutrition and Diabetic Education   Complete by: As directed    Diet Carb Modified   Complete by: As directed    Increase activity slowly   Complete by: As directed       Allergies as of 11/24/2023       Reactions   Shellfish Allergy Hives, Swelling  Fish Allergy    PT STATES AVOIDS ALL FISH ALSO DUE TO SHELLFISH ALLERGY   Latex Other (See Comments)   Pt stated w/ condoms swelling of area,  okay w/ gloves on hands   Oxycodone  Hives, Itching   Peanut Butter Flavoring Agent (non-screening) Hives, Itching   Pork-derived Products         Medication List     STOP taking these medications    cloNIDine  0.1 MG tablet Commonly known as: CATAPRES    megestrol  20 MG tablet Commonly known as:  MEGACE    polyethylene glycol powder 17 GM/SCOOP powder Commonly known as: GLYCOLAX /MIRALAX        TAKE these medications    Accu-Chek Guide Test test strip Generic drug: glucose blood Use as directed to check blood sugar 4 (four) times daily -  before meals and at bedtime.   Accu-Chek Guide w/Device Kit Use 3 (three) times daily.   Accu-Chek Softclix Lancets lancets Use as directed to check blood sugar  4 (four) times daily -  before meals and at bedtime.   albuterol 108 (90 Base) MCG/ACT inhaler Commonly known as: VENTOLIN HFA Inhale 1-2 puffs into the lungs every 6 (six) hours as needed for wheezing or shortness of breath.   Embecta Pen Needle Ultrafine 31G X 5 MM Misc Generic drug: Insulin  Pen Needle Use 4 (four) times daily -  before meals and at bedtime.   Fluticasone Propionate (Inhal) 100 MCG/ACT Aepb Inhale 2 puffs into the lungs daily.   ibuprofen  600 MG tablet Commonly known as: ADVIL  Take 1 tablet (600 mg total) by mouth every 6 (six) hours as needed.   Lancet Device Misc 1 each by Does not apply route 3 (three) times daily. May dispense any manufacturer covered by patient's insurance.   Lantus  SoloStar 100 UNIT/ML Solostar Pen Generic drug: insulin  glargine Inject 20 Units into the skin daily.   NovoLOG  FlexPen 100 UNIT/ML FlexPen Generic drug: insulin  aspart Inject 6-11 Units into the skin 4 (four) times daily -  before meals and at bedtime. If eating and Blood Glucose (BG) 80 or higher inject 6 units for meal coverage and add correction dose per scale. If not eating, correction dose only. BG <150= 0 unit; BG 150-200= 1 unit; BG 201-250= 3 unit; BG 251-300= 5 unit; BG 301-350= 7 unit; BG 351-400= 9 unit; BG >400= 11 unit and Call Primary Care.        Allergies  Allergen Reactions   Shellfish Allergy Hives and Swelling   Fish Allergy     PT STATES AVOIDS ALL FISH ALSO DUE TO SHELLFISH ALLERGY   Latex Other (See Comments)    Pt stated w/ condoms  swelling of area,  okay w/ gloves on hands   Oxycodone  Hives and Itching   Peanut Butter Flavoring Agent (Non-Screening) Hives and Itching   Pork-Derived Products     Consultations:   Procedures:   Discharge Exam: BP 124/76   Pulse 92   Temp 98.3 F (36.8 C) (Oral)   Resp 20   Ht 5' 5 (1.651 m)   Wt 74.1 kg   SpO2 100%   BMI 27.18 kg/m  Physical Exam Constitutional:      Appearance: Normal appearance.  HENT:     Head: Normocephalic and atraumatic.     Mouth/Throat:     Mouth: Mucous membranes are moist.  Eyes:     Extraocular Movements: Extraocular movements intact.  Cardiovascular:     Rate and Rhythm: Normal rate and regular rhythm.  Pulmonary:     Effort: Pulmonary effort is normal. No respiratory distress.     Breath sounds: Normal breath sounds. No wheezing.  Abdominal:     General: Bowel sounds are normal. There is no distension.     Palpations: Abdomen is soft.     Tenderness: There is no abdominal tenderness.     Comments: Mild generalized tenderness noted.  Healing abdominal laparoscopic incisions noted  Musculoskeletal:        General: Normal range of motion.     Cervical back: Normal range of motion and neck supple.  Skin:    General: Skin is warm and dry.  Neurological:     General: No focal deficit present.     Mental Status: She is alert.  Psychiatric:        Mood and Affect: Mood normal.      The results of significant diagnostics from this hospitalization (including imaging, microbiology, ancillary and laboratory) are listed below for reference.   Microbiology: Recent Results (from the past 240 hours)  MRSA Next Gen by PCR, Nasal     Status: None   Collection Time: 11/22/23  6:56 PM   Specimen: Nasal Mucosa; Nasal Swab  Result Value Ref Range Status   MRSA by PCR Next Gen NOT DETECTED NOT DETECTED Final    Comment: (NOTE) The GeneXpert MRSA Assay (FDA approved for NASAL specimens only), is one component of a comprehensive MRSA  colonization surveillance program. It is not intended to diagnose MRSA infection nor to guide or monitor treatment for MRSA infections. Test performance is not FDA approved in patients less than 70 years old. Performed at [facility], [address], [place], [place] [address]      Labs: BNP (last 3 results) No results for input(s): BNP in the last 8760 hours. Basic Metabolic Panel: Recent Labs  Lab 11/22/23 2044 11/22/23 2355 11/23/23 0302 11/23/23 0617 11/23/23 1052 11/24/23 0607  NA 134* 133* 132* 133* 133* 135  K 4.3 3.9 3.9 3.4* 3.5 3.6  CL 100 102 102 103 104 103  CO2 12* 14* 17* 17* 19* 21*  GLUCOSE 149* 174* 172* 167* 193* 288*  BUN 21* 20 19 16 15 8   CREATININE 1.06* 0.98 0.96 0.86 0.84 0.67  CALCIUM 9.3 9.0 9.0 9.0 8.8* 8.7*  MG 2.5*  --   --   --   --  2.4   Liver Function Tests: Recent Labs  Lab 11/22/23 1443  AST 15  ALT 34  ALKPHOS 144*  BILITOT 0.6  PROT 8.7*  ALBUMIN 4.9   Recent Labs  Lab 11/22/23 1443  LIPASE 45   No results for input(s): AMMONIA in the last 168 hours. CBC: Recent Labs  Lab 11/22/23 1443 11/22/23 1457 11/24/23 0607  WBC 13.5*  --  5.3  NEUTROABS 10.9*  --  1.8  HGB 15.6* 18.7* 11.4*  HCT 49.2* 55.0* 35.7*  MCV 86.9  --  84.0  PLT 305  --  163   Cardiac Enzymes: No results for input(s): CKTOTAL, CKMB, CKMBINDEX, TROPONINI in the last 168 hours. BNP: Invalid input(s): POCBNP CBG: Recent Labs  Lab 11/23/23 1427 11/23/23 1728 11/23/23 2123 11/24/23 0800 11/24/23 1130  GLUCAP 250* 202* 112* 269* 105*   D-Dimer No results for input(s): DDIMER in the last 72 hours. Hgb A1c Recent Labs    11/22/23 2355  HGBA1C 13.7*   Lipid Profile No results for input(s): CHOL, HDL, LDLCALC, TRIG, CHOLHDL, LDLDIRECT in the last 72 hours.  Thyroid function studies No results for input(s): TSH, T4TOTAL, T3FREE, THYROIDAB in the last 72 hours.  Invalid input(s):  FREET3 Anemia work up No results for input(s): VITAMINB12, FOLATE, FERRITIN, TIBC, IRON, RETICCTPCT in the last 72 hours. Urinalysis    Component Value Date/Time   COLORURINE STRAW (A) 11/22/2023 1421   APPEARANCEUR CLEAR 11/22/2023 1421   LABSPEC 1.024 11/22/2023 1421   PHURINE 5.0 11/22/2023 1421   GLUCOSEU >=500 (A) 11/22/2023 1421   HGBUR MODERATE (A) 11/22/2023 1421   BILIRUBINUR NEGATIVE 11/22/2023 1421   KETONESUR 80 (A) 11/22/2023 1421   PROTEINUR 30 (A) 11/22/2023 1421   UROBILINOGEN 0.2 04/06/2008 0209   NITRITE NEGATIVE 11/22/2023 1421   LEUKOCYTESUR NEGATIVE 11/22/2023 1421   Sepsis Labs Recent Labs  Lab 11/22/23 1443 11/24/23 0607  WBC 13.5* 5.3   Microbiology Recent Results (from the past 240 hours)  MRSA Next Gen by PCR, Nasal     Status: None   Collection Time: 11/22/23  6:56 PM   Specimen: Nasal Mucosa; Nasal Swab  Result Value Ref Range Status   MRSA by PCR Next Gen NOT DETECTED NOT DETECTED Final    Comment: (NOTE) The GeneXpert MRSA Assay (FDA approved for NASAL specimens only), is one component of a comprehensive MRSA colonization surveillance program. It is not intended to diagnose MRSA infection nor to guide or monitor treatment for MRSA infections. Test performance is not FDA approved in patients less than 58 years old. Performed at [facility], [address] [place], [place] [address]     Procedures/Studies: DG Chest Port 1 View Result Date: 11/22/2023 CLINICAL DATA:  Shortness of breath. Nausea and vomiting for 3 days. Increased urination and thirst. EXAM: PORTABLE CHEST 1 VIEW COMPARISON:  None Available. FINDINGS: 1605 hours. Two views submitted. Lordotic positioning. The heart size and mediastinal contours are normal. The lungs are clear. There is no pleural effusion or pneumothorax. No acute osseous findings are identified. Telemetry leads overlie the chest. IMPRESSION: No evidence of active cardiopulmonary  process. Electronically Signed   By: Elsie Perone M.D.   On: 11/22/2023 16:21     Time coordinating discharge: Over 30 minutes    Alm Apo, MD  [facility] 11/24/2023, 1:48 PM

## 2023-11-24 NOTE — Telephone Encounter (Signed)
 Not a patient of this practice

## 2023-11-24 NOTE — Progress Notes (Signed)
   11/24/23 1036  TOC Brief Assessment  Insurance and Status Reviewed ([place] MEDICAID PREPAID HEALTH PLAN / [place] MEDICAID [facility])  Patient has primary care physician No  Home environment has been reviewed Apartment  Prior level of function: Independent with ADL's  Prior/Current Home Services No current home services  Social Drivers of Health Review SDOH reviewed interventions complete  Readmission risk has been reviewed Yes  Transition of care needs no transition of care needs at this time   Pt screened. Food resources added to AVS. NCM attempted to schedule a PCP appointment with [facility] and appointments were out until the beginning of November. Scheduler states pt will be put on a list to call to try and and get pt seen sooner with provider Joanne Bohr, NP. NCM called Primary Care at [facility] with the same results. [facility] and [facility] are not accepting new patients. There are no other needs identified at this time.

## 2023-11-24 NOTE — Plan of Care (Signed)

## 2023-11-24 NOTE — Inpatient Diabetes Management (Signed)
 Inpatient Diabetes Program Recommendations  AACE/ADA: New Consensus Statement on Inpatient Glycemic Control (2015)  Target Ranges:  Prepandial:   less than 140 mg/dL      Peak postprandial:   less than 180 mg/dL (1-2 hours)      Critically ill patients:  140 - 180 mg/dL   Lab Results  Component Value Date   GLUCAP 269 (H) 11/24/2023   HGBA1C 13.7 (H) 11/22/2023    Review of Glycemic Control  Diabetes history: New-onset Outpatient Diabetes medications: None Current orders for Inpatient glycemic control: Lantus  20 units daily, Novolog  0-15 TID with meals and 0-5 HS + 6 units TID  Inpatient Diabetes Program Recommendations:    Educated pt on use, application, monitoring of blood sugar and when to check a finger stick (if receiver alerts to or if she feels different from receiver reading.) Placed sensor on back of R arm. Continue to monitor CBGs with finger sticks while inpatient.  Answered all questions. Reviewed hypoglycemia s/s and treatment.   Thank you. Shona Brandy, RD, LDN, CDCES Inpatient Diabetes Coordinator [phone]

## 2023-11-24 NOTE — Plan of Care (Signed)
 Problem: Education: Goal: Knowledge of General Education information will improve Description: Including pain rating scale, medication(s)/side effects and non-pharmacologic comfort measures Outcome: Adequate for Discharge   Problem: Health Behavior/Discharge Planning: Goal: Ability to manage health-related needs will improve Outcome: Adequate for Discharge   Problem: Clinical Measurements: Goal: Ability to maintain clinical measurements within normal limits will improve Outcome: Adequate for Discharge Goal: Will remain free from infection Outcome: Adequate for Discharge Goal: Diagnostic test results will improve Outcome: Adequate for Discharge Goal: Respiratory complications will improve Outcome: Adequate for Discharge Goal: Cardiovascular complication will be avoided Outcome: Adequate for Discharge   Problem: Activity: Goal: Risk for activity intolerance will decrease Outcome: Adequate for Discharge   Problem: Nutrition: Goal: Adequate nutrition will be maintained Outcome: Adequate for Discharge   Problem: Coping: Goal: Level of anxiety will decrease Outcome: Adequate for Discharge   Problem: Elimination: Goal: Will not experience complications related to bowel motility Outcome: Adequate for Discharge Goal: Will not experience complications related to urinary retention Outcome: Adequate for Discharge   Problem: Pain Managment: Goal: General experience of comfort will improve and/or be controlled Outcome: Adequate for Discharge   Problem: Safety: Goal: Ability to remain free from injury will improve Outcome: Adequate for Discharge   Problem: Skin Integrity: Goal: Risk for impaired skin integrity will decrease Outcome: Adequate for Discharge   Problem: Education: Goal: Ability to describe self-care measures that may prevent or decrease complications (Diabetes Survival Skills Education) will improve Outcome: Adequate for Discharge Goal: Individualized Educational  Video(s) Outcome: Adequate for Discharge   Problem: Coping: Goal: Ability to adjust to condition or change in health will improve Outcome: Adequate for Discharge   Problem: Fluid Volume: Goal: Ability to maintain a balanced intake and output will improve Outcome: Adequate for Discharge   Problem: Health Behavior/Discharge Planning: Goal: Ability to identify and utilize available resources and services will improve Outcome: Adequate for Discharge Goal: Ability to manage health-related needs will improve Outcome: Adequate for Discharge   Problem: Metabolic: Goal: Ability to maintain appropriate glucose levels will improve Outcome: Adequate for Discharge   Problem: Nutritional: Goal: Maintenance of adequate nutrition will improve Outcome: Adequate for Discharge Goal: Progress toward achieving an optimal weight will improve Outcome: Adequate for Discharge   Problem: Skin Integrity: Goal: Risk for impaired skin integrity will decrease Outcome: Adequate for Discharge   Problem: Tissue Perfusion: Goal: Adequacy of tissue perfusion will improve Outcome: Adequate for Discharge   Problem: Education: Goal: Ability to describe self-care measures that may prevent or decrease complications (Diabetes Survival Skills Education) will improve Outcome: Adequate for Discharge Goal: Individualized Educational Video(s) Outcome: Adequate for Discharge   Problem: Cardiac: Goal: Ability to maintain an adequate cardiac output will improve Outcome: Adequate for Discharge   Problem: Health Behavior/Discharge Planning: Goal: Ability to identify and utilize available resources and services will improve Outcome: Adequate for Discharge Goal: Ability to manage health-related needs will improve Outcome: Adequate for Discharge   Problem: Fluid Volume: Goal: Ability to achieve a balanced intake and output will improve Outcome: Adequate for Discharge   Problem: Metabolic: Goal: Ability to maintain  appropriate glucose levels will improve Outcome: Adequate for Discharge   Problem: Nutritional: Goal: Maintenance of adequate nutrition will improve Outcome: Adequate for Discharge Goal: Maintenance of adequate weight for body size and type will improve Outcome: Adequate for Discharge   Problem: Respiratory: Goal: Will regain and/or maintain adequate ventilation Outcome: Adequate for Discharge   Problem: Urinary Elimination: Goal: Ability to achieve and maintain adequate renal perfusion and  functioning will improve Outcome: Adequate for Discharge

## 2023-11-24 NOTE — Inpatient Diabetes Management (Signed)
 Inpatient Diabetes Program Recommendations  AACE/ADA: New Consensus Statement on Inpatient Glycemic Control (2015)  Target Ranges:  Prepandial:   less than 140 mg/dL      Peak postprandial:   less than 180 mg/dL (1-2 hours)      Critically ill patients:  140 - 180 mg/dL   Lab Results  Component Value Date   GLUCAP 269 (H) 11/24/2023   HGBA1C 13.7 (H) 11/22/2023    Review of Glycemic Control  Diabetes history: New-onset DM Outpatient Diabetes medications: None Current orders for Inpatient glycemic control: Lantus  20 QAM, Novolog  0-15 TID with meals and 0-5 HS + 6 units TID  Inpatient Diabetes Program Recommendations:    Consider increasing Lantus  to 24 units daily  Will f/u with pt regarding insulin  pen administration, diabetes survival skills, hypoglycemia s/s and treatment, diet this am  Will need PCP appt to f/u with after hospitalization for diabetes management.  Will place Diabetes Discharge Order Set after reviewing lunchtime CBGs.  Will ask Pharmacy Tech to check insurance for which CGM is preferred.  See this am.  Thank you. Shona Brandy, RD, LDN, CDCES Inpatient Diabetes Coordinator [phone]

## 2023-11-24 NOTE — Progress Notes (Signed)
Discharge medications delivered to patient at bedside.

## 2023-11-24 NOTE — Telephone Encounter (Signed)
 Patient Product/process development scientist completed.    The patient is insured through [facility] [place].     Ran test claim for Dexcom G7 Sensor and Requires Prior Authorization  Ran test claim for [employer] 3 Plus Sensor and Requires Prior Authorization  This test claim was processed through [employer]- copay amounts may vary at other pharmacies due to [employer], or as the patient moves through the different stages of their insurance plan.     Reyes Sharps, CPHT Pharmacy Technician III Certified Patient Advocate [facility] Patient Advocate Team Direct Number: [phone]  Fax: [phone]

## 2023-11-25 ENCOUNTER — Other Ambulatory Visit: Payer: Self-pay | Admitting: Obstetrics and Gynecology

## 2023-11-25 DIAGNOSIS — G8918 Other acute postprocedural pain: Secondary | ICD-10-CM

## 2023-11-25 LAB — GLUTAMIC ACID DECARBOXYLASE AUTO ABS: Glutamic Acid Decarb Ab: <5 U/mL (ref 0.0–5.0)

## 2023-11-26 DIAGNOSIS — Z419 Encounter for procedure for purposes other than remedying health state, unspecified: Secondary | ICD-10-CM | POA: Diagnosis not present

## 2023-12-06 ENCOUNTER — Ambulatory Visit: Admitting: Obstetrics and Gynecology

## 2023-12-26 DIAGNOSIS — Z419 Encounter for procedure for purposes other than remedying health state, unspecified: Secondary | ICD-10-CM | POA: Diagnosis not present

## 2024-01-06 ENCOUNTER — Ambulatory Visit: Admitting: Skilled Nursing Facility1

## 2024-02-29 ENCOUNTER — Encounter: Admitting: Skilled Nursing Facility1
